# Patient Record
Sex: Male | Born: 1950 | Race: White | Hispanic: No | Marital: Married | Smoking: Former smoker
Health system: Southern US, Community
[De-identification: ages and names within clinical notes are randomized; demographics above are authoritative.]

## PROBLEM LIST (undated history)

## (undated) DIAGNOSIS — F1090 Alcohol use, unspecified, uncomplicated: Secondary | ICD-10-CM

## (undated) DIAGNOSIS — Z789 Other specified health status: Secondary | ICD-10-CM

## (undated) DIAGNOSIS — Z8619 Personal history of other infectious and parasitic diseases: Secondary | ICD-10-CM

## (undated) DIAGNOSIS — J449 Chronic obstructive pulmonary disease, unspecified: Secondary | ICD-10-CM

## (undated) DIAGNOSIS — F17200 Nicotine dependence, unspecified, uncomplicated: Secondary | ICD-10-CM

## (undated) DIAGNOSIS — R0600 Dyspnea, unspecified: Secondary | ICD-10-CM

## (undated) DIAGNOSIS — Z8709 Personal history of other diseases of the respiratory system: Secondary | ICD-10-CM

## (undated) HISTORY — PX: VARICOSE VEIN SURGERY: SHX832

## (undated) HISTORY — DX: Personal history of other infectious and parasitic diseases: Z86.19

## (undated) HISTORY — DX: Personal history of other diseases of the respiratory system: Z87.09

## (undated) HISTORY — DX: Alcohol use, unspecified, uncomplicated: F10.90

## (undated) HISTORY — DX: Nicotine dependence, unspecified, uncomplicated: F17.200

## (undated) HISTORY — DX: Other specified health status: Z78.9

---

## 2008-09-19 ENCOUNTER — Ambulatory Visit: Payer: Self-pay | Admitting: Internal Medicine

## 2008-09-19 DIAGNOSIS — I8390 Asymptomatic varicose veins of unspecified lower extremity: Secondary | ICD-10-CM | POA: Insufficient documentation

## 2008-09-19 DIAGNOSIS — F17200 Nicotine dependence, unspecified, uncomplicated: Secondary | ICD-10-CM | POA: Insufficient documentation

## 2008-10-08 ENCOUNTER — Encounter: Payer: Self-pay | Admitting: Internal Medicine

## 2008-11-30 HISTORY — PX: INGUINAL HERNIA REPAIR: SUR1180

## 2009-01-18 ENCOUNTER — Encounter: Payer: Self-pay | Admitting: Internal Medicine

## 2009-04-23 ENCOUNTER — Encounter: Payer: Self-pay | Admitting: Internal Medicine

## 2009-05-08 ENCOUNTER — Emergency Department: Payer: Self-pay | Admitting: Emergency Medicine

## 2010-08-28 ENCOUNTER — Emergency Department: Payer: Self-pay | Admitting: Emergency Medicine

## 2014-01-17 ENCOUNTER — Ambulatory Visit (INDEPENDENT_AMBULATORY_CARE_PROVIDER_SITE_OTHER): Payer: Commercial Indemnity | Admitting: Family Medicine

## 2014-01-17 ENCOUNTER — Encounter: Payer: Self-pay | Admitting: Family Medicine

## 2014-01-17 VITALS — BP 140/76 | HR 68 | Temp 98.3°F | Ht 66.75 in | Wt 184.0 lb

## 2014-01-17 DIAGNOSIS — Z23 Encounter for immunization: Secondary | ICD-10-CM

## 2014-01-17 DIAGNOSIS — R21 Rash and other nonspecific skin eruption: Secondary | ICD-10-CM | POA: Insufficient documentation

## 2014-01-17 DIAGNOSIS — Z1211 Encounter for screening for malignant neoplasm of colon: Secondary | ICD-10-CM

## 2014-01-17 DIAGNOSIS — Z789 Other specified health status: Secondary | ICD-10-CM

## 2014-01-17 DIAGNOSIS — F17200 Nicotine dependence, unspecified, uncomplicated: Secondary | ICD-10-CM

## 2014-01-17 DIAGNOSIS — I8391 Asymptomatic varicose veins of right lower extremity: Secondary | ICD-10-CM | POA: Insufficient documentation

## 2014-01-17 DIAGNOSIS — F1090 Alcohol use, unspecified, uncomplicated: Secondary | ICD-10-CM

## 2014-01-17 DIAGNOSIS — Z0000 Encounter for general adult medical examination without abnormal findings: Secondary | ICD-10-CM | POA: Insufficient documentation

## 2014-01-17 DIAGNOSIS — I8390 Asymptomatic varicose veins of unspecified lower extremity: Secondary | ICD-10-CM

## 2014-01-17 MED ORDER — TRIAMCINOLONE ACETONIDE 0.1 % EX CREA: 1.0000 "application " | TOPICAL_CREAM | Freq: Two times a day (BID) | CUTANEOUS | Status: DC

## 2014-01-17 NOTE — Patient Instructions (Signed)
Flu shot today. Good to meet you today, call us with questions. Think about sandwich bag method to help you quit smoking. For rash - may try triamcinolone cream and daily moisturizing.  If worsening, let me know. Return fasting for blood work

## 2014-01-17 NOTE — Addendum Note (Signed)
Addended by: Royann Shivers A on: 01/17/2014 11:45 AM   Modules accepted: Orders

## 2014-01-17 NOTE — Assessment & Plan Note (Signed)
With some stigmata of liver disease on exam - will check CMP and CBC at next visit. Discussed reasons to quit heavy alcohol use.

## 2014-01-17 NOTE — Assessment & Plan Note (Signed)
?  related to varicose veins.  Dry patches - treat with moisturizer and TCI cream.  Not consistent with tinea infection today.

## 2014-01-17 NOTE — Progress Notes (Signed)
BP 140/76  Pulse 68  Temp(Src) 98.3 F (36.8 C) (Oral)  Ht 5' 6.75" (1.695 m)  Wt 184 lb (83.462 kg)  BMI 29.05 kg/m2   CC: new pt to establish  Subjective:    Patient ID: Guy Hester, male    DOB: 01-20-1951, 63 y.o.   MRN: [id number]  HPI: [name] is a 63 y.o. male presenting on 01/17/2014 with Establish Care  Would like physical today.  Would like rash on leg evaluated.  2 mo h/o R leg rash.  Not spreading, some itchy.  Treating with calomine which helps.  R leg vein issues - wears compression stockings Smoker - 1 ppd.  Started smoking 1965.  50+ PY hx.  Has tried e cig and vapes, didn't do well with either.  Has tried patches which caused nightmares.  Discussed sandwich bag method. Alcohol use - up to 1.5 liter wine or 12 pack beer on weekends.  Started drinking 1965, heavy later.  Has cut back from hard liquor.  Lives with wife, daughter and granddaughter Occupation: Associate Professor Edu: HS Activity: fishes Diet: poor water, poor fruits/vegetables  Seat belt use discussed. Sunscreen use discussed  Preventative: No recent CPE Colon cancer screening - discussed options and pt would like colonoscopy. Prostate cancer screening - discussed, would like to defer screening for now. Flu shot - today Tetanus - has gotten within last 5 yrs  Relevant past medical, surgical, family and social history reviewed and updated. Allergies and medications reviewed and updated. No current outpatient prescriptions on file prior to visit.   No current facility-administered medications on file prior to visit.    Review of Systems  Constitutional: Negative for fever, chills, activity change, appetite change, fatigue and unexpected weight change.  HENT: Negative for hearing loss.   Eyes: Negative for visual disturbance.  Respiratory: Positive for cough (smoker's). Negative for chest tightness, shortness of breath and wheezing.   Cardiovascular: Negative for chest pain,  palpitations and leg swelling.  Gastrointestinal: Negative for nausea, vomiting, abdominal pain, diarrhea, constipation, blood in stool and abdominal distention.  Genitourinary: Negative for hematuria and difficulty urinating.  Musculoskeletal: Negative for arthralgias, myalgias and neck pain.  Skin: Negative for rash.  Neurological: Negative for dizziness, seizures, syncope and headaches.  Hematological: Negative for adenopathy. Does not bruise/bleed easily.  Psychiatric/Behavioral: Negative for dysphoric mood. The patient is not nervous/anxious.    Per HPI unless specifically indicated above    Objective:    BP 140/76  Pulse 68  Temp(Src) 98.3 F (36.8 C) (Oral)  Ht 5' 6.75" (1.695 m)  Wt 184 lb (83.462 kg)  BMI 29.05 kg/m2  Physical Exam  Nursing note and vitals reviewed. Constitutional: He is oriented to person, place, and time. He appears well-developed and well-nourished. No distress.  HENT:  Head: Normocephalic and atraumatic.  Right Ear: Hearing, tympanic membrane, external ear and ear canal normal.  Left Ear: Hearing, tympanic membrane, external ear and ear canal normal.  Nose: Nose normal.  Mouth/Throat: Uvula is midline, oropharynx is clear and moist and mucous membranes are normal. No oropharyngeal exudate, posterior oropharyngeal edema or posterior oropharyngeal erythema.  Eyes: Conjunctivae and EOM are normal. Pupils are equal, round, and reactive to light. No scleral icterus.  Neck: Normal range of motion. Neck supple.  Cardiovascular: Normal rate, regular rhythm, normal heart sounds and intact distal pulses.   No murmur heard. Pulses:      Radial pulses are 2+ on the right side, and 2+ on the left side.  Pulmonary/Chest: Effort normal and breath sounds normal. No respiratory distress. He has no wheezes. He has no rales.  Abdominal: Soft. Bowel sounds are normal. He exhibits no distension and no mass. There is no tenderness. There is no rebound and no guarding.    Musculoskeletal: Normal range of motion. He exhibits no edema.  Large varicose veins along RLE  Lymphadenopathy:    He has no cervical adenopathy.  Neurological: He is alert and oriented to person, place, and time.  CN grossly intact, station and gait intact  Skin: Skin is warm and dry. Rash noted.  Dry hyperpigmented patches along several of his varicose veins of RLE Spider angiomas present on lower extremities and abdomen  Psychiatric: He has a normal mood and affect. His behavior is normal. Judgment and thought content normal.   No results found for this or any previous visit.    Assessment & Plan:   Problem List Items Addressed This Visit   Asymptomatic varicose veins of right lower extremity   Health care maintenance - Primary     Preventative protocols reviewed and updated unless pt declined. Discussed healthy diet and lifestyle.  Flu shot today Pt declines prostate screening. Will refer for colonoscopy    Relevant Orders      Lipid panel      Comprehensive metabolic panel   Heavy alcohol use     With some stigmata of liver disease on exam - will check CMP and CBC at next visit. Discussed reasons to quit heavy alcohol use.    Relevant Orders      Comprehensive metabolic panel      CBC with Differential      Protime-INR   Skin rash     ?related to varicose veins.  Dry patches - treat with moisturizer and TCI cream.  Not consistent with tinea infection today.    Smoker     Discussed reasons to cut back and eventually quit smoking. Continue to encourage cessation.     Other Visit Diagnoses   Special screening for malignant neoplasms, colon        Relevant Orders       Ambulatory referral to Gastroenterology        Follow up plan: Return in about 1 year (around 01/17/2015), or as needed, for annual exam, prior fasting for blood work.

## 2014-01-17 NOTE — Progress Notes (Signed)
Pre-visit discussion using our clinic review tool. No additional management support is needed unless otherwise documented below in the visit note.  

## 2014-01-17 NOTE — Assessment & Plan Note (Signed)
Preventative protocols reviewed and updated unless pt declined. Discussed healthy diet and lifestyle.  Flu shot today Pt declines prostate screening. Will refer for colonoscopy

## 2014-01-17 NOTE — Assessment & Plan Note (Signed)
Discussed reasons to cut back and eventually quit smoking. Continue to encourage cessation.

## 2014-01-18 ENCOUNTER — Telehealth: Payer: Self-pay | Admitting: Family Medicine

## 2014-01-18 NOTE — Telephone Encounter (Signed)
Relevant patient education assigned to patient using Emmi. ° °

## 2014-01-19 ENCOUNTER — Encounter: Payer: Self-pay | Admitting: *Deleted

## 2014-01-19 ENCOUNTER — Other Ambulatory Visit (INDEPENDENT_AMBULATORY_CARE_PROVIDER_SITE_OTHER): Payer: Commercial Indemnity

## 2014-01-19 DIAGNOSIS — F1090 Alcohol use, unspecified, uncomplicated: Secondary | ICD-10-CM

## 2014-01-19 DIAGNOSIS — Z0000 Encounter for general adult medical examination without abnormal findings: Secondary | ICD-10-CM

## 2014-01-19 DIAGNOSIS — Z789 Other specified health status: Secondary | ICD-10-CM

## 2014-01-19 LAB — CBC WITH DIFFERENTIAL/PLATELET
BASOS PCT: 0.4 % (ref 0.0–3.0)
Basophils Absolute: 0 10*3/uL (ref 0.0–0.1)
Eosinophils Absolute: 0.1 10*3/uL (ref 0.0–0.7)
Eosinophils Relative: 1.3 % (ref 0.0–5.0)
HCT: 45 % (ref 39.0–52.0)
Hemoglobin: 14.9 g/dL (ref 13.0–17.0)
Lymphocytes Relative: 36.7 % (ref 12.0–46.0)
Lymphs Abs: 2.6 10*3/uL (ref 0.7–4.0)
MCHC: 33 g/dL (ref 30.0–36.0)
MCV: 95.2 fl (ref 78.0–100.0)
MONO ABS: 0.8 10*3/uL (ref 0.1–1.0)
Monocytes Relative: 12.2 % — ABNORMAL HIGH (ref 3.0–12.0)
NEUTROS PCT: 49.4 % (ref 43.0–77.0)
Neutro Abs: 3.4 10*3/uL (ref 1.4–7.7)
PLATELETS: 282 10*3/uL (ref 150.0–400.0)
RBC: 4.72 Mil/uL (ref 4.22–5.81)
RDW: 13.3 % (ref 11.5–14.6)
WBC: 7 10*3/uL (ref 4.5–10.5)

## 2014-01-19 LAB — COMPREHENSIVE METABOLIC PANEL
ALBUMIN: 4 g/dL (ref 3.5–5.2)
ALT: 11 U/L (ref 0–53)
AST: 19 U/L (ref 0–37)
Alkaline Phosphatase: 62 U/L (ref 39–117)
BUN: 12 mg/dL (ref 6–23)
CO2: 26 mEq/L (ref 19–32)
Calcium: 8.6 mg/dL (ref 8.4–10.5)
Chloride: 103 mEq/L (ref 96–112)
Creatinine, Ser: 0.9 mg/dL (ref 0.4–1.5)
GFR: 95.66 mL/min (ref >60.00)
Glucose, Bld: 88 mg/dL (ref 70–99)
POTASSIUM: 4.1 meq/L (ref 3.5–5.1)
SODIUM: 137 meq/L (ref 135–145)
TOTAL PROTEIN: 6.7 g/dL (ref 6.0–8.3)
Total Bilirubin: 0.5 mg/dL (ref 0.3–1.2)

## 2014-01-19 LAB — PROTIME-INR
INR: 1.1 ratio — AB (ref 0.8–1.0)
Prothrombin Time: 11.4 s (ref 10.2–12.4)

## 2014-01-19 LAB — LIPID PANEL
Cholesterol: 167 mg/dL (ref 0–200)
HDL: 87.2 mg/dL (ref >39.00)
LDL CALC: 69 mg/dL (ref 0–99)
Total CHOL/HDL Ratio: 2
Triglycerides: 53 mg/dL (ref 0.0–149.0)
VLDL: 10.6 mg/dL (ref 0.0–40.0)

## 2014-06-26 ENCOUNTER — Telehealth: Payer: Self-pay | Admitting: Family Medicine

## 2014-06-26 NOTE — Telephone Encounter (Signed)
Have called the patient multiple times to schedule Colonoscopy. Never returned our calls. Would like to cancel this referral. Please advise

## 2014-06-26 NOTE — Telephone Encounter (Signed)
Ok to cancel referral. thanks.

## 2015-01-08 ENCOUNTER — Emergency Department: Payer: Self-pay | Admitting: Emergency Medicine

## 2015-01-16 ENCOUNTER — Emergency Department: Payer: Self-pay | Admitting: Emergency Medicine

## 2019-05-18 ENCOUNTER — Telehealth: Payer: Self-pay | Admitting: Family Medicine

## 2019-05-18 NOTE — Telephone Encounter (Signed)
Any 30 min slot next week is fine.thanks.

## 2019-05-18 NOTE — Telephone Encounter (Signed)
Patient's wife called today to schedule appt for patient to have place on his back looked at. She stated it looks like a mole but they are not sure.   Patient has not been seen since 2015 and would need to be re est. When would be a good time to schedule him for a 30 min appt since I know many of your 30 min slots are gone  Thanks!

## 2019-05-25 ENCOUNTER — Other Ambulatory Visit: Payer: Self-pay

## 2019-05-25 ENCOUNTER — Encounter: Payer: Self-pay | Admitting: Family Medicine

## 2019-05-25 ENCOUNTER — Ambulatory Visit (INDEPENDENT_AMBULATORY_CARE_PROVIDER_SITE_OTHER): Payer: Medicare Other | Admitting: Family Medicine

## 2019-05-25 VITALS — BP 134/70 | HR 68 | Temp 97.6°F | Ht 66.75 in | Wt 211.6 lb

## 2019-05-25 DIAGNOSIS — F17200 Nicotine dependence, unspecified, uncomplicated: Secondary | ICD-10-CM | POA: Diagnosis not present

## 2019-05-25 DIAGNOSIS — W57XXXA Bitten or stung by nonvenomous insect and other nonvenomous arthropods, initial encounter: Secondary | ICD-10-CM | POA: Diagnosis not present

## 2019-05-25 DIAGNOSIS — S20362A Insect bite (nonvenomous) of left front wall of thorax, initial encounter: Secondary | ICD-10-CM | POA: Diagnosis not present

## 2019-05-25 NOTE — Assessment & Plan Note (Signed)
Tick present for the past ~2 wks. Tick fully removed. Symptoms of tick born illness reviewed. Update if these symptoms develop for abx course. Pt agrees with plan.

## 2019-05-25 NOTE — Patient Instructions (Addendum)
Tick removed. Keep area clean and dress with antibiotic ointment daily.  Watch for fever, chill, headache, new joint pains, new rash, abd pain, nausea, fatigue over the next week. Let us know if any of these symptoms develop for antibiotic.  Return for medicare wellness visit at your convenience.  Tick Bite Information, Adult  Ticks are insects that draw blood for food. Most ticks live in shrubs and grassy areas. They climb onto people and animals that brush against the leaves and grasses that they rest on. Then they bite, attaching themselves to the skin. Most ticks are harmless, but some ticks carry germs that can spread to a person through a bite and cause a disease. To reduce your risk of getting a disease from a tick bite, it is important to take steps to prevent tick bites. It is also important to check for ticks after being outdoors. If you find that a tick has attached to you, watch for symptoms of disease. How can I prevent tick bites? Take these steps to help prevent tick bites when you are outdoors in an area where ticks are found:  Use insect repellent that has DEET (20% or higher), picaridin, or IR3535 in it. Use it on: ? Skin that is showing. ? The top of your boots. ? Your pant legs. ? Your sleeve cuffs.  For repellent products that contain permethrin, follow product instructions. Use these products on: ? Clothing. ? Gear. ? Boots. ? Tents.  Wear protective clothing. Long sleeves and long pants offer the best protection from ticks.  Wear light-colored clothing so you can see ticks more easily.  Tuck your pant legs into your socks.  If you go walking on a trail, stay in the middle of the trail so your skin, hair, and clothing do not touch the bushes.  Avoid walking through areas with long grass.  Check for ticks on your clothing, hair, and skin often while you are outside, and check again before you go inside. Make sure to check the places that ticks attach themselves  most often. These places include the scalp, neck, armpits, waist, groin, and joint areas. Ticks that carry a disease called Lyme disease have to be attached to the skin for 24-48 hours. Checking for ticks every day will lessen your risk of this and other diseases.  When you come indoors, wash your clothes and take a shower or a bath right away. Dry your clothes in a dryer on high heat for at least 60 minutes. This will kill any ticks in your clothes. What is the proper way to remove a tick? If you find a tick on your body, remove it as soon as possible. Removing a tick sooner rather than later can prevent germs from passing from the tick to your body. To remove a tick that is crawling on your skin but has not bitten:  Go outdoors and brush the tick off.  Remove the tick with tape or a lint roller. To remove a tick that is attached to your skin:  Wash your hands.  If you have latex gloves, put them on.  Use tweezers, curved forceps, or a tick-removal tool to gently grasp the tick as close to your skin and the tick's head as possible.  Gently pull with steady, upward pressure until the tick lets go. When removing the tick: ? Take care to keep the tick's head attached to its body. ? Do not twist or jerk the tick. This can make the tick's head  or mouth break off. ? Do not squeeze or crush the tick's body. This could force disease-carrying fluids from the tick into your body. Do not try to remove a tick with heat, alcohol, petroleum jelly, or fingernail polish. Using these methods can cause the tick to salivate and regurgitate into your bloodstream, increasing your risk of getting a disease. What should I do after removing a tick?  Clean the bite area with soap and water, rubbing alcohol, or an iodine scrub.  If an antiseptic cream or ointment is available, apply a small amount to the bite site.  Wash and disinfect any instruments that you used to remove the tick. How should I dispose of a  tick? To dispose of a live tick, use one of these methods:  Place it in rubbing alcohol.  Place it in a sealed bag or container.  Wrap it tightly in tape.  Flush it down the toilet. Contact a health care provider if:  You have symptoms of a disease after a tick bite. Symptoms of a tick-borne disease can occur from moments after the tick bites to up to 30 days after a tick is removed. Symptoms include: ? Muscle, joint, or bone pain. ? Difficulty walking or moving your legs. ? Numbness in the legs. ? Paralysis. ? Red rash around the tick bite area that is shaped like a target or a "bull's-eye." ? Redness and swelling in the area of the tick bite. ? Fever. ? Repeated vomiting. ? Diarrhea. ? Weight loss. ? Tender, swollen lymph glands. ? Shortness of breath. ? Cough. ? Pain in the abdomen. ? Headache. ? Abnormal tiredness. ? A change in your level of consciousness. ? Confusion. Get help right away if:  You are not able to remove a tick.  A part of a tick breaks off and gets stuck in your skin.  Your symptoms get worse. Summary  Ticks may carry germs that can spread to a person through a bite and cause disease.  Wear protective clothing and use insect repellent to prevent tick bites. Follow product instructions.  If you find a tick on your body, remove it as soon as possible. If the tick is attached, do not try to remove with heat, alcohol, petroleum jelly, or fingernail polish.  Remove the attached tick using tweezers, curved forceps, or a tick-removal tool. Gently pull with steady, upward pressure until the tick lets go. Do not twist or jerk the tick. Do not squeeze or crush the tick's body.  If you have symptoms after being bitten by a tick, contact a health care provider. This information is not intended to replace advice given to you by your health care provider. Make sure you discuss any questions you have with your health care provider. Document Released:  11/13/2000 Document Revised: 08/28/2016 Document Reviewed: 08/28/2016 Elsevier Interactive Patient Education  2019 [employer].

## 2019-05-25 NOTE — Assessment & Plan Note (Signed)
Continue to encourage smoking cessation.  Discussed lung cancer screening CT program - he declines at this time.

## 2019-05-25 NOTE — Progress Notes (Signed)
This visit was conducted in person.  BP 134/70 (BP Location: Left Arm, Patient Position: Sitting, Cuff Size: Large)   Pulse 68   Temp 97.6 F (36.4 C) (Temporal)   Ht 5' 6.75" (1.695 m)   Wt 211 lb 9 oz (96 kg)   SpO2 98%   BMI 33.38 kg/m    CC: re establish Subjective:    Patient ID: Guy Hester, male    DOB: 01/08/1951, 68 y.o.   MRN: [id number]  HPI: Guy Hester is a 68 y.o. male presenting on 05/25/2019 for New Patient (Here to re-establish care. )   Last seen 2015.  Retired 09/2018! Smoking - 1 ppd for 50+ yrs. Contemplative. Has never tried anything to help him quit. Declines lung cancer screening program.   Spot on left side - present for 2 wks. Tender and itchy. Initially thought it was a tick, unsure now. Red around edges. No drainage. No fevers.   No fevers/chills, headache, new joint pains, new rash, abd pain, nausea, fatigue.      Relevant past medical, surgical, family and social history reviewed and updated as indicated. Interim medical history since our last visit reviewed. Allergies and medications reviewed and updated. Outpatient Medications Prior to Visit  Medication Sig Dispense Refill  . triamcinolone cream (KENALOG) 0.1 % Apply 1 application topically 2 (two) times daily. Apply to AA. 30 g 0   No facility-administered medications prior to visit.      Per HPI unless specifically indicated in ROS section below Review of Systems Objective:    BP 134/70 (BP Location: Left Arm, Patient Position: Sitting, Cuff Size: Large)   Pulse 68   Temp 97.6 F (36.4 C) (Temporal)   Ht 5' 6.75" (1.695 m)   Wt 211 lb 9 oz (96 kg)   SpO2 98%   BMI 33.38 kg/m   Wt Readings from Last 3 Encounters:  05/25/19 211 lb 9 oz (96 kg)  01/17/14 184 lb (83.5 kg)    Physical Exam Vitals signs and nursing note reviewed.  Constitutional:      Appearance: Normal appearance. He is not ill-appearing.  HENT:     Mouth/Throat:     Mouth: Mucous membranes are moist.     Pharynx: No posterior oropharyngeal erythema.  Eyes:     Extraocular Movements: Extraocular movements intact.     Pupils: Pupils are equal, round, and reactive to light.  Cardiovascular:     Rate and Rhythm: Normal rate and regular rhythm.     Pulses: Normal pulses.     Heart sounds: Normal heart sounds. No murmur.  Pulmonary:     Effort: Pulmonary effort is normal. No respiratory distress.     Breath sounds: Normal breath sounds. No wheezing, rhonchi or rales.  Skin:    General: Skin is warm and dry.     Findings: Erythema present.     Comments: Engorged tick on left lateral side fully removed with forceps, area cleaned with alcohol and dressed with abx ointment and bandaid.   Neurological:     Mental Status: He is alert.  Psychiatric:        Mood and Affect: Mood normal.        Behavior: Behavior normal.       Assessment & Plan:   Problem List Items Addressed This Visit    Tick bite of left side of chest wall - Primary    Tick present for the past ~2 wks. Tick fully removed. Symptoms of tick born illness  reviewed. Update if these symptoms develop for abx course. Pt agrees with plan.       Smoker    Continue to encourage smoking cessation.  Discussed lung cancer screening CT program - he declines at this time.           No orders of the defined types were placed in this encounter.  No orders of the defined types were placed in this encounter.   Patient instructions: Tick removed. Keep area clean and dress with antibiotic ointment daily.  Watch for fever, chill, headache, new joint pains, new rash, abd pain, nausea, fatigue over the next week. Let us know if any of these symptoms develop for antibiotic.  Return for medicare wellness visit at your convenience.   Follow up plan: Return if symptoms worsen or fail to improve, for medicare wellness visit.  Ria Bush, MD

## 2020-06-16 ENCOUNTER — Other Ambulatory Visit: Payer: Self-pay | Admitting: Family Medicine

## 2020-06-16 DIAGNOSIS — Z136 Encounter for screening for cardiovascular disorders: Secondary | ICD-10-CM

## 2020-06-16 DIAGNOSIS — Z125 Encounter for screening for malignant neoplasm of prostate: Secondary | ICD-10-CM

## 2020-06-16 DIAGNOSIS — Z131 Encounter for screening for diabetes mellitus: Secondary | ICD-10-CM

## 2020-06-16 DIAGNOSIS — Z1159 Encounter for screening for other viral diseases: Secondary | ICD-10-CM

## 2020-06-16 DIAGNOSIS — Z13220 Encounter for screening for lipoid disorders: Secondary | ICD-10-CM

## 2020-06-18 ENCOUNTER — Other Ambulatory Visit: Payer: Self-pay

## 2020-06-18 ENCOUNTER — Other Ambulatory Visit (INDEPENDENT_AMBULATORY_CARE_PROVIDER_SITE_OTHER): Payer: Medicare Other

## 2020-06-18 DIAGNOSIS — Z125 Encounter for screening for malignant neoplasm of prostate: Secondary | ICD-10-CM | POA: Diagnosis not present

## 2020-06-18 DIAGNOSIS — Z13220 Encounter for screening for lipoid disorders: Secondary | ICD-10-CM

## 2020-06-18 DIAGNOSIS — Z131 Encounter for screening for diabetes mellitus: Secondary | ICD-10-CM

## 2020-06-18 DIAGNOSIS — Z1159 Encounter for screening for other viral diseases: Secondary | ICD-10-CM | POA: Diagnosis not present

## 2020-06-18 DIAGNOSIS — Z136 Encounter for screening for cardiovascular disorders: Secondary | ICD-10-CM | POA: Diagnosis not present

## 2020-06-18 LAB — LIPID PANEL
Cholesterol: 156 mg/dL (ref 0–200)
HDL: 51.4 mg/dL
LDL Cholesterol: 85 mg/dL (ref 0–99)
NonHDL: 104.95
Total CHOL/HDL Ratio: 3
Triglycerides: 100 mg/dL (ref 0.0–149.0)
VLDL: 20 mg/dL (ref 0.0–40.0)

## 2020-06-18 LAB — COMPREHENSIVE METABOLIC PANEL
ALT: 23 U/L (ref 0–53)
AST: 32 U/L (ref 0–37)
Albumin: 3.6 g/dL (ref 3.5–5.2)
Alkaline Phosphatase: 99 U/L (ref 39–117)
BUN: 10 mg/dL (ref 6–23)
CO2: 29 mEq/L (ref 19–32)
Calcium: 9.2 mg/dL (ref 8.4–10.5)
Chloride: 101 mEq/L (ref 96–112)
Creatinine, Ser: 1.08 mg/dL (ref 0.40–1.50)
GFR: 67.84 mL/min (ref >60.00)
Glucose, Bld: 161 mg/dL — ABNORMAL HIGH (ref 70–99)
Potassium: 4.7 mEq/L (ref 3.5–5.1)
Sodium: 138 mEq/L (ref 135–145)
Total Bilirubin: 0.6 mg/dL (ref 0.2–1.2)
Total Protein: 6.1 g/dL (ref 6.0–8.3)

## 2020-06-18 LAB — PSA, MEDICARE: PSA: 1.87 ng/mL (ref 0.10–4.00)

## 2020-06-19 LAB — HEPATITIS C ANTIBODY
Hepatitis C Ab: NONREACTIVE
SIGNAL TO CUT-OFF: 0.01 (ref <1.00)

## 2020-06-25 ENCOUNTER — Ambulatory Visit (INDEPENDENT_AMBULATORY_CARE_PROVIDER_SITE_OTHER)
Admission: RE | Admit: 2020-06-25 | Discharge: 2020-06-25 | Disposition: A | Discharge status: Home / Self Care | Payer: Medicare Other | Source: Ambulatory Visit | Attending: Family Medicine | Admitting: Family Medicine

## 2020-06-25 ENCOUNTER — Ambulatory Visit (INDEPENDENT_AMBULATORY_CARE_PROVIDER_SITE_OTHER): Payer: Medicare Other | Admitting: Family Medicine

## 2020-06-25 ENCOUNTER — Encounter: Payer: Self-pay | Admitting: Family Medicine

## 2020-06-25 ENCOUNTER — Other Ambulatory Visit: Payer: Self-pay

## 2020-06-25 VITALS — BP 124/80 | HR 101 | Temp 97.4°F | Ht 66.75 in | Wt 203.5 lb

## 2020-06-25 DIAGNOSIS — Z789 Other specified health status: Secondary | ICD-10-CM

## 2020-06-25 DIAGNOSIS — Z23 Encounter for immunization: Secondary | ICD-10-CM | POA: Diagnosis not present

## 2020-06-25 DIAGNOSIS — F17200 Nicotine dependence, unspecified, uncomplicated: Secondary | ICD-10-CM

## 2020-06-25 DIAGNOSIS — E119 Type 2 diabetes mellitus without complications: Secondary | ICD-10-CM | POA: Diagnosis not present

## 2020-06-25 DIAGNOSIS — R739 Hyperglycemia, unspecified: Secondary | ICD-10-CM

## 2020-06-25 DIAGNOSIS — R918 Other nonspecific abnormal finding of lung field: Secondary | ICD-10-CM | POA: Diagnosis not present

## 2020-06-25 DIAGNOSIS — R0602 Shortness of breath: Secondary | ICD-10-CM

## 2020-06-25 DIAGNOSIS — J449 Chronic obstructive pulmonary disease, unspecified: Secondary | ICD-10-CM | POA: Diagnosis not present

## 2020-06-25 DIAGNOSIS — J90 Pleural effusion, not elsewhere classified: Secondary | ICD-10-CM | POA: Diagnosis not present

## 2020-06-25 DIAGNOSIS — Z0000 Encounter for general adult medical examination without abnormal findings: Secondary | ICD-10-CM | POA: Diagnosis not present

## 2020-06-25 DIAGNOSIS — F1090 Alcohol use, unspecified, uncomplicated: Secondary | ICD-10-CM

## 2020-06-25 DIAGNOSIS — Z1211 Encounter for screening for malignant neoplasm of colon: Secondary | ICD-10-CM | POA: Diagnosis not present

## 2020-06-25 DIAGNOSIS — Z7189 Other specified counseling: Secondary | ICD-10-CM | POA: Diagnosis not present

## 2020-06-25 LAB — POCT GLYCOSYLATED HEMOGLOBIN (HGB A1C): Hemoglobin A1C: 6.9 % — AB (ref 4.0–5.6)

## 2020-06-25 MED ORDER — SPIRIVA HANDIHALER 18 MCG IN CAPS
18.0000 ug | ORAL_CAPSULE | Freq: Every day | RESPIRATORY_TRACT | 12 refills | Status: AC
Start: 2020-06-25 — End: ?

## 2020-06-25 MED ORDER — ALBUTEROL SULFATE HFA 108 (90 BASE) MCG/ACT IN AERS
2.0000 | INHALATION_SPRAY | Freq: Four times a day (QID) | RESPIRATORY_TRACT | 3 refills | Status: AC | PRN
Start: 2020-06-25 — End: ?

## 2020-06-25 MED ORDER — NICOTINE 14 MG/24HR TD PT24
14.0000 mg | MEDICATED_PATCH | Freq: Every day | TRANSDERMAL | 1 refills | Status: AC
Start: 2020-06-25 — End: ?

## 2020-06-25 NOTE — Assessment & Plan Note (Signed)
Has cut down to 2 glasses of wine/day - significant decrease from 3 L/day. Encouraged ongoing cessation.

## 2020-06-25 NOTE — Assessment & Plan Note (Signed)
Long-term 1 ppd smoker.  Currently in action phase.  Discussed NRT - sent in nicoderm cq patches 14mg /day. Discussed slow taper over weeks. rec minimizing smoking while using patch.

## 2020-06-25 NOTE — Assessment & Plan Note (Signed)
New diagnosis based on A1c 6.9%.  Reviewed low sugar low carb diet.  rec RTC 3 mo DM f/u visit.

## 2020-06-25 NOTE — Progress Notes (Signed)
This visit was conducted in person.  BP 124/80 (BP Location: Left Arm, Patient Position: Sitting, Cuff Size: Normal)   Pulse 101   Temp (!) 97.4 F (36.3 C) (Temporal)   Ht 5' 6.75" (1.695 m)   Wt (!) 203 lb 8 oz (92.3 kg)   SpO2 96%   BMI 32.11 kg/m    CC: CPE Subjective:    Patient ID: Guy Hester, male    DOB: 26-Apr-1951, 69 y.o.   MRN: [id number]  HPI: Guy Hester is a 69 y.o. male presenting on 06/25/2020 for Annual Exam   Received medicare 2 yrs ago.  Did not see health advisor this year.   No exam data present    Office Visit from 06/25/2020 in [place] at [facility] Total Score 1      No flowsheet data found.    Progressive dyspnea worse in humidity over several months. No fever.   Preventative: Colon cancer screening - discussed options - would like iFOB  Prostate cancer screening - discussed - check PSA  Lung cancer screening - discussed - interested.  Flu shot - tries yearly Td 10/20209 Prevnar today  COVID vaccine - considering  Shingrix - did not discuss Advanced directive discussion - deferred with abnormal CXR findings.  Seat belt use discussed Sunscreen use discussed. No changing moles on skin. Smoking - 1 ppd for 50+ yrs. Action phase - wife wants him to quit. "i'm gonna quit today" - has previously tried patches.  Alcohol  - cut down 1-2 months ago - from 3L/day down to 2 glasses of wine/day  Dentist - has dentures Eye exam - due Bowel - no constipation. Notes decreased appetite.  Bladder - no incontinence  Lives with wife, daughter and granddaughter  Occupation: Associate Professor - retired 2019 Edu: HS  Activity: fishes  Diet: poor water, poor fruits/vegetables     Relevant past medical, surgical, family and social history reviewed and updated as indicated. Interim medical history since our last visit reviewed. Allergies and medications reviewed and updated. No outpatient medications prior to visit.   No  facility-administered medications prior to visit.     Per HPI unless specifically indicated in ROS section below Review of Systems Objective:  BP 124/80 (BP Location: Left Arm, Patient Position: Sitting, Cuff Size: Normal)   Pulse 101   Temp (!) 97.4 F (36.3 C) (Temporal)   Ht 5' 6.75" (1.695 m)   Wt (!) 203 lb 8 oz (92.3 kg)   SpO2 96%   BMI 32.11 kg/m   Wt Readings from Last 3 Encounters:  06/25/20 (!) 203 lb 8 oz (92.3 kg)  05/25/19 211 lb 9 oz (96 kg)  01/17/14 184 lb (83.5 kg)      Physical Exam Vitals and nursing note reviewed.  Constitutional:      General: He is not in acute distress.    Appearance: Normal appearance. He is well-developed. He is not ill-appearing.  HENT:     Head: Normocephalic and atraumatic.     Right Ear: Hearing, tympanic membrane, ear canal and external ear normal.     Left Ear: Hearing, tympanic membrane, ear canal and external ear normal.     Mouth/Throat:     Pharynx: Uvula midline.  Eyes:     General: No scleral icterus.    Extraocular Movements: Extraocular movements intact.     Conjunctiva/sclera: Conjunctivae normal.     Pupils: Pupils are equal, round, and reactive to light.  Neck:  Thyroid: No thyroid mass or thyromegaly.     Vascular: No carotid bruit.  Cardiovascular:     Rate and Rhythm: Normal rate and regular rhythm.     Pulses: Normal pulses.          Radial pulses are 2+ on the right side and 2+ on the left side.     Heart sounds: Normal heart sounds. No murmur heard.   Pulmonary:     Effort: No respiratory distress.     Breath sounds: Rhonchi (left sided) present. No wheezing or rales.     Comments: Coarse breath sounds L>R Abdominal:     General: Abdomen is flat. Bowel sounds are normal. There is no distension.     Palpations: Abdomen is soft. There is no mass.     Tenderness: There is no abdominal tenderness. There is no guarding or rebound.     Hernia: No hernia is present.  Musculoskeletal:        General:  Normal range of motion.     Cervical back: Normal range of motion and neck supple.     Right lower leg: No edema.     Left lower leg: No edema.  Lymphadenopathy:     Cervical: No cervical adenopathy.  Skin:    General: Skin is warm and dry.     Findings: No rash.  Neurological:     General: No focal deficit present.     Mental Status: He is alert and oriented to person, place, and time.     Comments:  CN grossly intact, station and gait intact Recall 2/3, 3/3 with cue Calculation 5/5 DLROW   Psychiatric:        Mood and Affect: Mood normal.        Behavior: Behavior normal.        Thought Content: Thought content normal.        Judgment: Judgment normal.       Results for orders placed or performed in visit on 06/25/20  HgB A1c  Result Value Ref Range   Hemoglobin A1C 6.9 (A) 4.0 - 5.6 %   HbA1c POC (<> result, manual entry)     HbA1c, POC (prediabetic range)     HbA1c, POC (controlled diabetic range)    DG Chest 2 View CLINICAL DATA:  Dyspnea, smoker, COPD  EXAM: CHEST - 2 VIEW  COMPARISON:  08/29/2010  FINDINGS: The heart size and mediastinal contours are within normal limits. There is a small right pleural effusion with fluid in the minor fissure and a dense, masslike opacity of the perihilar right lung. Left lung is hyperinflated although normally aerated. The visualized skeletal structures are unremarkable.  IMPRESSION: There is a small right pleural effusion with fluid in the minor fissure and a dense, masslike opacity of the perihilar right lung, findings concerning for malignancy, although infection is a differential consideration. Recommend CT to further evaluate.  These results will be called to the ordering clinician or representative by the Radiologist Assistant, and communication documented in the PACS or [employer].  Electronically Signed   By: Eddie Candle M.D.   On: 06/25/2020 09:54   Assessment & Plan:  This visit occurred during the  SARS-CoV-2 public health emergency.  Safety protocols were in place, including screening questions prior to the visit, additional usage of staff PPE, and extensive cleaning of exam room while observing appropriate contact time as indicated for disinfecting solutions.   Problem List Items Addressed This Visit    Smoker  Long-term 1 ppd smoker.  Currently in action phase.  Discussed NRT - sent in nicoderm cq patches 14mg /day. Discussed slow taper over weeks. rec minimizing smoking while using patch.       Shortness of breath    Progressively worsening over the past year - anticipate COPD component. Start albuterol inhaler, start spiriva. Reviewed rescue vs controller medication.  Check CXR given dyspnea in smoker - concern for L hilar lung mass. Will order contrasted CT scan.       Relevant Orders   DG Chest 2 View (Completed)   CT Chest W Contrast   Medicare annual wellness visit, initial - Primary    I have personally reviewed the Medicare Annual Wellness questionnaire and have noted 1. The patient's medical and social history 2. Their use of alcohol, tobacco or illicit drugs 3. Their current medications and supplements 4. The patient's functional ability including ADL's, fall risks, home safety risks and hearing or visual impairment. Cognitive function has been assessed and addressed as indicated.  5. Diet and physical activity 6. Evidence for depression or mood disorders The patients weight, height, BMI have been recorded in the chart. I have made referrals, counseling and provided education to the patient based on review of the above and I have provided the pt with a written personalized care plan for preventive services. Provider list updated.. See scanned questionairre as needed for further documentation. Reviewed preventative protocols and updated unless pt declined.       Heavy alcohol use    Has cut down to 2 glasses of wine/day - significant decrease from 3 L/day.  Encouraged ongoing cessation.       Controlled type 2 diabetes mellitus without complication, without long-term current use of insulin ([facility])    New diagnosis based on A1c 6.9%.  Reviewed low sugar low carb diet.  rec RTC 3 mo DM f/u visit.       Advanced care planning/counseling discussion    Advanced directive discussion - deferred with abnormal CXR findings.        Other Visit Diagnoses    Need for pneumococcal vaccination       Relevant Orders   Pneumococcal conjugate vaccine 13-valent (Completed)   Special screening for malignant neoplasms, colon       Relevant Orders   Fecal occult blood, imunochemical   Hyperglycemia       Relevant Orders   HgB A1c (Completed)   Mass of left lung       Relevant Orders   CT Chest W Contrast       Meds ordered this encounter  Medications  . nicotine (NICODERM CQ) 14 mg/24hr patch    Sig: Place 1 patch (14 mg total) onto the skin daily.    Dispense:  28 patch    Refill:  1  . tiotropium (SPIRIVA HANDIHALER) 18 MCG inhalation capsule    Sig: Place 1 capsule (18 mcg total) into inhaler and inhale daily.    Dispense:  30 capsule    Refill:  12  . albuterol (VENTOLIN HFA) 108 (90 Base) MCG/ACT inhaler    Sig: Inhale 2 puffs into the lungs every 6 (six) hours as needed for wheezing or shortness of breath.    Dispense:  18 g    Refill:  3    Formulate based on affordability   Orders Placed This Encounter  Procedures  . Fecal occult blood, imunochemical    Standing Status:   Future    Standing Expiration Date:  06/25/2021  . DG Chest 2 View    Standing Status:   Future    Number of Occurrences:   1    Standing Expiration Date:   06/25/2021    Order Specific Question:   Reason for Exam (SYMPTOM  OR DIAGNOSIS REQUIRED)    Answer:   dyspnea, smoker eval COPD    Order Specific Question:   Preferred imaging location?    Answer:   [facility]    Order Specific Question:   Radiology Contrast Protocol - do NOT remove file path      Answer:   \\[email or web address]\DXFluoroContrastProtocols.pdf  . CT Chest W Contrast    Standing Status:   Future    Standing Expiration Date:   06/25/2021    Order Specific Question:   If indicated for the ordered procedure, I authorize the administration of contrast media per Radiology protocol    Answer:   Yes    Order Specific Question:   Preferred imaging location?    Answer:   [facility]    Order Specific Question:   Radiology Contrast Protocol - do NOT remove file path    Answer:   \\[email or web address]\Radiant\CTProtocols.pdf  . Pneumococcal conjugate vaccine 13-valent  . HgB A1c    Patient instructions: Prevnar13 first pneumonia shot today. I will ask lung cancer screening nurse to call you about CT scan program.   I do recommend COVID vaccine - consider getting.  Chest xray today.  Trial combivent inhaler as needed for shortness of breath, as well as daily controller medicine to help keep lungs breathing well.  A1c today given sugars were high - diet controlled diabetes range. Work on low sugar low carb diet to keep these levels under control. Return in 3 months for diabetes follow up visit.   Follow up plan: Return in about 3 months (around 09/25/2020) for follow up visit.  Ria Bush, MD

## 2020-06-25 NOTE — Patient Instructions (Addendum)
Prevnar13 first pneumonia shot today. I will ask lung cancer screening nurse to call you about CT scan program.   I do recommend COVID vaccine - consider getting.  Chest xray today.  Trial combivent inhaler as needed for shortness of breath, as well as daily controller medicine to help keep lungs breathing well.  A1c today given sugars were high - diet controlled diabetes range. Work on low sugar low carb diet to keep these levels under control. Return in 3 months for diabetes follow up visit.   Health Maintenance After Age 68 After age 50, you are at a higher risk for certain long-term diseases and infections as well as injuries from falls. Falls are a major cause of broken bones and head injuries in people who are older than age 7. Getting regular preventive care can help to keep you healthy and well. Preventive care includes getting regular testing and making lifestyle changes as recommended by your health care provider. Talk with your health care provider about:  Which screenings and tests you should have. A screening is a test that checks for a disease when you have no symptoms.  A diet and exercise plan that is right for you. What should I know about screenings and tests to prevent falls? Screening and testing are the best ways to find a health problem early. Early diagnosis and treatment give you the best chance of managing medical conditions that are common after age 36. Certain conditions and lifestyle choices may make you more likely to have a fall. Your health care provider may recommend:  Regular vision checks. Poor vision and conditions such as cataracts can make you more likely to have a fall. If you wear glasses, make sure to get your prescription updated if your vision changes.  Medicine review. Work with your health care provider to regularly review all of the medicines you are taking, including over-the-counter medicines. Ask your health care provider about any side effects that  may make you more likely to have a fall. Tell your health care provider if any medicines that you take make you feel dizzy or sleepy.  Osteoporosis screening. Osteoporosis is a condition that causes the bones to get weaker. This can make the bones weak and cause them to break more easily.  Blood pressure screening. Blood pressure changes and medicines to control blood pressure can make you feel dizzy.  Strength and balance checks. Your health care provider may recommend certain tests to check your strength and balance while standing, walking, or changing positions.  Foot health exam. Foot pain and numbness, as well as not wearing proper footwear, can make you more likely to have a fall.  Depression screening. You may be more likely to have a fall if you have a fear of falling, feel emotionally low, or feel unable to do activities that you used to do.  Alcohol use screening. Using too much alcohol can affect your balance and may make you more likely to have a fall. What actions can I take to lower my risk of falls? General instructions  Talk with your health care provider about your risks for falling. Tell your health care provider if: ? You fall. Be sure to tell your health care provider about all falls, even ones that seem minor. ? You feel dizzy, sleepy, or off-balance.  Take over-the-counter and prescription medicines only as told by your health care provider. These include any supplements.  Eat a healthy diet and maintain a healthy weight. A healthy diet  includes low-fat dairy products, low-fat (lean) meats, and fiber from whole grains, beans, and lots of fruits and vegetables. Home safety  Remove any tripping hazards, such as rugs, cords, and clutter.  Install safety equipment such as grab bars in bathrooms and safety rails on stairs.  Keep rooms and walkways well-lit. Activity   Follow a regular exercise program to stay fit. This will help you maintain your balance. Ask your  health care provider what types of exercise are appropriate for you.  If you need a cane or walker, use it as recommended by your health care provider.  Wear supportive shoes that have nonskid soles. Lifestyle  Do not drink alcohol if your health care provider tells you not to drink.  If you drink alcohol, limit how much you have: ? 0-1 drink a day for women. ? 0-2 drinks a day for men.  Be aware of how much alcohol is in your drink. In the [place] one drink equals one typical bottle of beer (12 oz), one-half glass of wine (5 oz), or one shot of hard liquor (1 oz).  Do not use any products that contain nicotine or tobacco, such as cigarettes and e-cigarettes. If you need help quitting, ask your health care provider. Summary  Having a healthy lifestyle and getting preventive care can help to protect your health and wellness after age 47.  Screening and testing are the best way to find a health problem early and help you avoid having a fall. Early diagnosis and treatment give you the best chance for managing medical conditions that are more common for people who are older than age 49.  Falls are a major cause of broken bones and head injuries in people who are older than age 18. Take precautions to prevent a fall at home.  Work with your health care provider to learn what changes you can make to improve your health and wellness and to prevent falls. This information is not intended to replace advice given to you by your health care provider. Make sure you discuss any questions you have with your health care provider. Document Revised: 03/09/2019 Document Reviewed: 09/29/2017 Elsevier Patient Education  2020 [employer].

## 2020-06-25 NOTE — Assessment & Plan Note (Signed)

## 2020-06-25 NOTE — Assessment & Plan Note (Signed)
Progressively worsening over the past year - anticipate COPD component. Start albuterol inhaler, start spiriva. Reviewed rescue vs controller medication.  Check CXR given dyspnea in smoker - concern for L hilar lung mass. Will order contrasted CT scan.

## 2020-06-25 NOTE — Assessment & Plan Note (Signed)
Advanced directive discussion - deferred with abnormal CXR findings.

## 2020-06-26 ENCOUNTER — Telehealth: Payer: Self-pay | Admitting: *Deleted

## 2020-06-26 ENCOUNTER — Other Ambulatory Visit: Payer: Self-pay

## 2020-06-26 ENCOUNTER — Telehealth: Payer: Self-pay | Admitting: Internal Medicine

## 2020-06-26 ENCOUNTER — Ambulatory Visit
Admission: RE | Admit: 2020-06-26 | Discharge: 2020-06-26 | Disposition: A | Discharge status: Home / Self Care | Payer: Medicare Other | Source: Ambulatory Visit | Attending: Family Medicine | Admitting: Family Medicine

## 2020-06-26 DIAGNOSIS — R0602 Shortness of breath: Secondary | ICD-10-CM | POA: Diagnosis not present

## 2020-06-26 DIAGNOSIS — C3490 Malignant neoplasm of unspecified part of unspecified bronchus or lung: Secondary | ICD-10-CM | POA: Diagnosis not present

## 2020-06-26 DIAGNOSIS — R918 Other nonspecific abnormal finding of lung field: Secondary | ICD-10-CM | POA: Diagnosis not present

## 2020-06-26 DIAGNOSIS — I2510 Atherosclerotic heart disease of native coronary artery without angina pectoris: Secondary | ICD-10-CM | POA: Diagnosis not present

## 2020-06-26 DIAGNOSIS — J90 Pleural effusion, not elsewhere classified: Secondary | ICD-10-CM | POA: Diagnosis not present

## 2020-06-26 DIAGNOSIS — I700 Atherosclerosis of aorta: Secondary | ICD-10-CM | POA: Diagnosis not present

## 2020-06-26 MED ORDER — IOHEXOL 300 MG/ML  SOLN
75.0000 mL | Freq: Once | INTRAMUSCULAR | Status: AC | PRN
  Administered 2020-06-26: 75 mL via INTRAVENOUS

## 2020-06-26 NOTE — Telephone Encounter (Signed)
Scheduled patient with Dr Learta Codding for this Friday 06/28/20 , patient notified. Also Dr Learta Codding has ordered an Interventional Radiology Referral for Biopsy and I told the patient to expect a call from them also.

## 2020-06-26 NOTE — Telephone Encounter (Signed)
I spoke with Guy Hester  Appt with Dr Vaughan Browner set for Friday at 3 pm  Per Mannam order IR consult for liver Biopsy- referral placed  She has left msg for pt to be aware of appt

## 2020-06-26 NOTE — Telephone Encounter (Signed)
Noted! Thank you

## 2020-06-26 NOTE — Telephone Encounter (Signed)
Spoke with patient and wife on speaker phone, reviewing recent CT. Will expedite pulm or onc evaluation.

## 2020-06-26 NOTE — Telephone Encounter (Signed)
Colletta Maryland called with stat results of a CT scan. Results are in Epic and they are not good. Phone call put on hold for Dr. Danise Mina to speak to the patient.

## 2020-06-27 ENCOUNTER — Encounter: Payer: Self-pay | Admitting: Family Medicine

## 2020-06-27 ENCOUNTER — Other Ambulatory Visit: Payer: Self-pay | Admitting: Pulmonary Disease

## 2020-06-27 DIAGNOSIS — R160 Hepatomegaly, not elsewhere classified: Secondary | ICD-10-CM

## 2020-06-28 ENCOUNTER — Encounter: Payer: Self-pay | Admitting: Pulmonary Disease

## 2020-06-28 ENCOUNTER — Other Ambulatory Visit: Payer: Self-pay

## 2020-06-28 ENCOUNTER — Encounter (HOSPITAL_COMMUNITY): Payer: Self-pay

## 2020-06-28 ENCOUNTER — Ambulatory Visit (INDEPENDENT_AMBULATORY_CARE_PROVIDER_SITE_OTHER): Payer: Medicare Other | Admitting: Pulmonary Disease

## 2020-06-28 VITALS — BP 142/84 | HR 114 | Temp 98.3°F | Ht 66.75 in | Wt 204.6 lb

## 2020-06-28 DIAGNOSIS — R918 Other nonspecific abnormal finding of lung field: Secondary | ICD-10-CM | POA: Diagnosis not present

## 2020-06-28 NOTE — Progress Notes (Signed)
Trayton Szabo    [id number]    [birth year]  Primary Care Physician:Gutierrez, Garlon Hatchet, MD  Referring Physician: Ria Bush, MD [address] [place],  [place] [address]  Chief complaint: Consult for lung mass  HPI: 69 year old smoker found to have abnormal chest x-ray on wellness visit at Dr. Danise Mina, his primary care.  Subsequent CT showed findings concerning for stage IV lung cancer with right hilar mass and has been referred to pulmonary for further evaluation.  Chief complaint is cough.  Denies any dyspnea, hemoptysis, weight loss  Pets: Dog, cat, parrot for 6 years Occupation: Retired Furniture conservator/restorer Exposures: No known exposures.  No mold, hot tub, Jacuzzi Smoking history: 50-pack-year smoker.  Quit 3 days ago Travel history: Originally from [place].  No significant recent travel Relevant family history: No significant family issue of lung disease  Outpatient Encounter Medications as of 06/28/2020  Medication Sig   albuterol (VENTOLIN HFA) 108 (90 Base) MCG/ACT inhaler Inhale 2 puffs into the lungs every 6 (six) hours as needed for wheezing or shortness of breath.   nicotine (NICODERM CQ) 14 mg/24hr patch Place 1 patch (14 mg total) onto the skin daily.   tiotropium (SPIRIVA HANDIHALER) 18 MCG inhalation capsule Place 1 capsule (18 mcg total) into inhaler and inhale daily.   No facility-administered encounter medications on file as of 06/28/2020.    Allergies as of 06/28/2020   (No Known Allergies)    Past Medical History:  Diagnosis Date   Heavy alcohol use    History of asthma    childhood   History of chicken pox    Smoker     Past Surgical History:  Procedure Laterality Date   INGUINAL HERNIA REPAIR Right 2010   VARICOSE VEIN SURGERY Right    laser    Family History  Problem Relation Age of Onset   Diabetes Mother    Stroke Mother 24       x3   Aneurysm Father        thoracic aortic aneurysm rupture   Diabetes  Brother    Diabetes Brother    Cancer Neg Hx    CAD Neg Hx     Social History   Socioeconomic History   Marital status: Married    Spouse name: Not on file   Number of children: Not on file   Years of education: Not on file   Highest education level: Not on file  Occupational History   Not on file  Tobacco Use   Smoking status: Former Smoker    Packs/day: 1.00    Years: 50.00    Pack years: 50.00    Types: Cigarettes    Start date: 12/01/1963    Quit date: 06/24/2020    Years since quitting: 0.0   Smokeless tobacco: Never Used   Tobacco comment: stopped 06/24/20  Substance and Sexual Activity   Alcohol use: Yes    Comment: Regular-daily beer and wine   Drug use: No   Sexual activity: Not on file  Other Topics Concern   Not on file  Social History Narrative   Lives with wife, daughter and granddaughter   Occupation: Associate Professor   Edu: HS   Activity: fishes   Diet: poor water, poor fruits/vegetables   Social Determinants of Health   Financial Resource Strain:    Difficulty of Paying Living Expenses:   Food Insecurity:    Worried About Estate manager/land agent of Food in the Last Year:  Ran Out of Food in the Last Year:   Transportation Needs:    Film/video editor (Medical):    Lack of Transportation (Non-Medical):   Physical Activity:    Days of Exercise per Week:    Minutes of Exercise per Session:   Stress:    Feeling of Stress :   Social Connections:    Frequency of Communication with Friends and Family:    Frequency of Social Gatherings with Friends and Family:    Attends Religious Services:    Active Member of Clubs or Organizations:    Attends Music therapist:    Marital Status:   Intimate Partner Violence:    Fear of Current or Ex-Partner:    Emotionally Abused:    Physically Abused:    Sexually Abused:     Review of systems: Review of Systems  Constitutional: Negative for fever and chills.  HENT:  Negative.   Eyes: Negative for blurred vision.  Respiratory: as per HPI  Cardiovascular: Negative for chest pain and palpitations.  Gastrointestinal: Negative for vomiting, diarrhea, blood per rectum. Genitourinary: Negative for dysuria, urgency, frequency and hematuria.  Musculoskeletal: Negative for myalgias, back pain and joint pain.  Skin: Negative for itching and rash.  Neurological: Negative for dizziness, tremors, focal weakness, seizures and loss of consciousness.  Endo/Heme/Allergies: Negative for environmental allergies.  Psychiatric/Behavioral: Negative for depression, suicidal ideas and hallucinations.  All other systems reviewed and are negative.  Physical Exam: Blood pressure (!) 142/84, pulse (!) 114, temperature 98.3 F (36.8 C), temperature source Oral, height 5' 6.75" (1.695 m), weight (!) 204 lb 9.6 oz (92.8 kg), SpO2 98 %. Gen:      No acute distress HEENT:  EOMI, sclera anicteric Neck:     No masses; no thyromegaly Lungs:    Clear to auscultation bilaterally; normal respiratory effort CV:         Regular rate and rhythm; no murmurs Abd:      + bowel sounds; soft, non-tender; no palpable masses, no distension Ext:    No edema; adequate peripheral perfusion Skin:      Warm and dry; no rash Neuro: alert and oriented x 3 Psych: normal mood and affect  Data Reviewed: Imaging: CT chest 06/26/2020-extensive disease with right upper lobe, right hilar mass, lymph nodes, effusion, liver, adrenal mets. I have reviewed the images personally  PFTs:  Labs:  Assessment:  Abnormal CT Findings are concerning for metastatic lung cancer I have discussed scan with interventional radiology at [facility].  The best approach would be lymph node in the upper chest, subclavicular area.  The pleural effusion is small and would be likely be of low yield and the liver lesions are not very well defined. He has been scheduled for CT biopsy on 8/4.  If this is nondiagnostic then we will  proceed with bronchoscopy and endobronchial ultrasound biopsy  Patient and his wife had multiple questions about prognosis, stage of cancer.  All questions answered.  Plan/Recommendations: IR CT-guided biopsy Follow-up in 1 month.  Marshell Garfinkel MD [facility] 06/28/2020, 3:05 PM  CC: Ria Bush, MD

## 2020-06-28 NOTE — Patient Instructions (Signed)
I have reviewed your CT scan with you with findings that are highly suspicious for malignancy You are scheduled for CT-guided biopsy next week.  If that is nondiagnostic then plan be would be to do a bronchoscopic biopsy I will follow with you and determine next steps as needed Follow-up in clinic in 1 to 2 months.

## 2020-06-28 NOTE — Progress Notes (Signed)
[place] Male, 69 y.o., 04-18-1951 MRN:  [id number] Phone:  [phone] Jerilynn Mages) PCP:  Ria Bush, MD Primary Cvg:  Medicare/Medicare Part A And B Next Appt With Radiology ([facility] 3) 07/03/2020 at 8:00 AM  RE: Biopsy Received: Yesterday Message Details  Corrie Mckusick, DO  [facility] [place] for image guided biopsy of right chest wall mass image 108 of series CT-2, from chest CT 7/28.   Discussed with Dr. Vaughan Browner.   Would perform in [place] with [place].   Earleen Newport   Previous Messages  ----- Message -----  From: Lenore Cordia  Sent: 06/27/2020 12:13 PM EDT  To: Ir Procedure Requests  Subject: Biopsy                      Procedure Requested: US Biopsy (liver)    Reason for Procedure: liver mass    Provider Requesting: Marshell Garfinkel  Provider Telephone: [phone]    Other Info:

## 2020-07-01 ENCOUNTER — Ambulatory Visit
Admission: RE | Admit: 2020-07-01 | Discharge: 2020-07-01 | Disposition: A | Discharge status: Home / Self Care | Payer: Medicare Other | Source: Ambulatory Visit | Attending: Pulmonary Disease | Admitting: Pulmonary Disease

## 2020-07-01 ENCOUNTER — Other Ambulatory Visit: Payer: Self-pay

## 2020-07-01 DIAGNOSIS — Z20822 Contact with and (suspected) exposure to covid-19: Secondary | ICD-10-CM | POA: Insufficient documentation

## 2020-07-01 DIAGNOSIS — Z01812 Encounter for preprocedural laboratory examination: Secondary | ICD-10-CM | POA: Diagnosis not present

## 2020-07-02 ENCOUNTER — Other Ambulatory Visit: Payer: Self-pay | Admitting: Student

## 2020-07-02 LAB — SARS CORONAVIRUS 2 (TAT 6-24 HRS): SARS Coronavirus 2: NEGATIVE

## 2020-07-02 NOTE — H&P (Signed)
Chief Complaint: Patient was seen in consultation today for an image-guided biopsy of a right chest wall mass.    Referring Physician(s): Mannam,Praveen  Supervising Physician: Corrie Mckusick  Patient Status: [facility] - Out-pt  History of Present Illness: Guy Hester is a 69 y.o. male with a medical history that includes asthma, COPD, smoking and heavy alcohol use. He presented to his PCP in July 2021 for progressively worsening shortness of breath over the past year. A mass-like opacity in the perihilar region of the right lung was seen on CXR 06/25/20. CT imaging was performed.    CT chest w/contrast 06/26/20 IMPRESSION: 1. Stage IV lung cancer as evidenced by conglomerate right paratracheal/right hilar adenopathy and masslike consolidation in the right upper lobe with pleuroparenchymal, hepatic, adrenal and abdominal nodal metastases. 2. Vague area of low attenuation in the pancreatic body. Difficult to exclude metastatic disease. 3. Cirrhosis. 4. Aortic atherosclerosis (ICD10-I70.0). Coronary artery calcification. 5. Emphysema (ICD10-J43.9).  Interventional Radiology has been asked to evaluate this patient for an image-guided biopsy of a right chest wall mass for further work up and diagnosis. This case has been reviewed and procedure approved by Dr. Earleen Newport.   Past Medical History:  Diagnosis Date   COPD (chronic obstructive pulmonary disease) (HCC)    Dyspnea    Heavy alcohol use    History of asthma    childhood   History of chicken pox    Smoker     Past Surgical History:  Procedure Laterality Date   INGUINAL HERNIA REPAIR Right 2010   VARICOSE VEIN SURGERY Right    laser    Allergies: Patient has no known allergies.  Medications: Prior to Admission medications   Medication Sig Start Date End Date Taking? Authorizing Provider  albuterol (VENTOLIN HFA) 108 (90 Base) MCG/ACT inhaler Inhale 2 puffs into the lungs every 6 (six) hours as needed for  wheezing or shortness of breath. 06/25/20  Yes Ria Bush, MD  nicotine (NICODERM CQ) 14 mg/24hr patch Place 1 patch (14 mg total) onto the skin daily. 06/25/20  Yes Ria Bush, MD  tiotropium (SPIRIVA HANDIHALER) 18 MCG inhalation capsule Place 1 capsule (18 mcg total) into inhaler and inhale daily. 06/25/20  Yes Ria Bush, MD     Family History  Problem Relation Age of Onset   Diabetes Mother    Stroke Mother 57       x3   Aneurysm Father        thoracic aortic aneurysm rupture   Diabetes Brother    Diabetes Brother    Cancer Neg Hx    CAD Neg Hx     Social History   Socioeconomic History   Marital status: Married    Spouse name: Not on file   Number of children: Not on file   Years of education: Not on file   Highest education level: Not on file  Occupational History   Not on file  Tobacco Use   Smoking status: Former Smoker    Packs/day: 1.00    Years: 50.00    Pack years: 50.00    Types: Cigarettes    Start date: 12/01/1963    Quit date: 06/24/2020    Years since quitting: 0.0   Smokeless tobacco: Never Used   Tobacco comment: stopped 06/24/20  Vaping Use   Vaping Use: Never used  Substance and Sexual Activity   Alcohol use: Not Currently    Comment: Regular-daily beer and wine   Drug use: No   Sexual activity:  Not on file  Other Topics Concern   Not on file  Social History Narrative   Lives with wife, daughter and granddaughter   Occupation: Associate Professor   Edu: HS   Activity: fishes   Diet: poor water, poor fruits/vegetables   Social Determinants of Health   Financial Resource Strain:    Difficulty of Paying Living Expenses:   Food Insecurity:    Worried About Charity fundraiser in the Last Year:    Arboriculturist in the Last Year:   Transportation Needs:    Film/video editor (Medical):    Lack of Transportation (Non-Medical):   Physical Activity:    Days of Exercise per Week:    Minutes of  Exercise per Session:   Stress:    Feeling of Stress :   Social Connections:    Frequency of Communication with Friends and Family:    Frequency of Social Gatherings with Friends and Family:    Attends Religious Services:    Active Member of Clubs or Organizations:    Attends Archivist Meetings:    Marital Status:     Review of Systems: A 12 point ROS discussed and pertinent positives are indicated in the HPI above.  All other systems are negative.  Review of Systems  Constitutional: Positive for appetite change and fatigue.  Respiratory: Positive for shortness of breath.   Cardiovascular: Negative for chest pain and leg swelling.  Gastrointestinal: Positive for abdominal distention and constipation. Negative for nausea and vomiting.  Musculoskeletal: Negative for back pain.  Neurological: Negative for headaches.    Vital Signs: BP 136/74    Pulse 84    Temp 97.7 F (36.5 C) (Oral)    Resp 20    Ht 5' 6.75" (1.695 m)    Wt 203 lb (92.1 kg)    SpO2 94%    BMI 32.03 kg/m   Physical Exam Constitutional:      General: He is not in acute distress.    Appearance: He is ill-appearing.  HENT:     Mouth/Throat:     Mouth: Mucous membranes are moist.     Pharynx: Oropharynx is clear.  Cardiovascular:     Rate and Rhythm: Normal rate and regular rhythm.     Pulses: Normal pulses.     Heart sounds: Normal heart sounds.  Pulmonary:     Effort: Pulmonary effort is normal.     Breath sounds: Normal breath sounds.  Abdominal:     General: Bowel sounds are normal.     Palpations: Abdomen is soft.     Tenderness: There is no abdominal tenderness.  Musculoskeletal:     Cervical back: Normal range of motion.  Skin:    General: Skin is warm and dry.  Neurological:     Mental Status: He is alert and oriented to person, place, and time.     Imaging: DG Chest 2 View  Result Date: 06/25/2020 CLINICAL DATA:  Dyspnea, smoker, COPD EXAM: CHEST - 2 VIEW COMPARISON:   08/29/2010 FINDINGS: The heart size and mediastinal contours are within normal limits. There is a small right pleural effusion with fluid in the minor fissure and a dense, masslike opacity of the perihilar right lung. Left lung is hyperinflated although normally aerated. The visualized skeletal structures are unremarkable. IMPRESSION: There is a small right pleural effusion with fluid in the minor fissure and a dense, masslike opacity of the perihilar right lung, findings concerning for malignancy, although infection is  a differential consideration. Recommend CT to further evaluate. These results will be called to the ordering clinician or representative by the Radiologist Assistant, and communication documented in the PACS or [employer]. Electronically Signed   By: Eddie Candle M.D.   On: 06/25/2020 09:54   CT Chest W Contrast  Result Date: 06/26/2020 CLINICAL DATA:  Abnormal chest radiograph. EXAM: CT CHEST WITH CONTRAST TECHNIQUE: Multidetector CT imaging of the chest was performed during intravenous contrast administration. CONTRAST:  64mL OMNIPAQUE IOHEXOL 300 MG/ML  SOLN COMPARISON:  Chest radiograph 06/25/2020. FINDINGS: Cardiovascular: Atherosclerotic calcification of the aorta and coronary arteries. Right upper lobe pulmonary artery is encased by a soft tissue mass. Heart size normal. No pericardial effusion. Mediastinum/Nodes: Low internal jugular lymph nodes are seen bilaterally, measuring up to 10 mm on the right (2/21). Conglomerate nodal mass in the low right paratracheal station measures 5.9 x 8.0 cm. Mass is contiguous with heterogeneous right hilar masslike soft tissue. There is obstruction of the right upper lobe bronchus. Left paratracheal lymph node measures 9 mm. No axillary adenopathy. Esophagus is grossly unremarkable. Lungs/Pleura: Centrilobular and paraseptal emphysema. Masslike consolidation in the anterior and posterior segments of the right upper lobe is difficult to measure and  is contiguous with conglomerate nodal right paratracheal/right hilar nodal mass. There are multiple bilateral pulmonary nodules. Index left upper lobe nodule measures 5 mm (3/57). Moderate right pleural effusion with extensive pleural nodularity at the base of the right hemithorax. Extrapleural lymph nodes in the inferior anterior right hemithorax measure up to 12 mm. Narrowing of the bronchus intermedius, in addition to obstruction of the right upper lobe bronchus. Upper Abdomen: Liver margin is irregular. There are multiple subtle heterogeneous low-attenuation masses bilaterally, measuring up to 2.1 x 3.5 cm, likely within segment 4 of the liver (2/143). Visualized portion of the gallbladder is unremarkable. Right adrenal mass measures 3.0 x 3.6 cm. Left adrenal nodule measures 2.1 x 2.1 cm. 10 mm low-attenuation lesion in the medial left kidney is too small to characterize. Kidneys and spleen are unremarkable. There may be a vague area of low attenuation in the pancreatic body, measuring 1.8 cm (2/167). Visualized portions of the stomach and bowel are grossly unremarkable. Upper abdominal adenopathy measures up to 2.7 cm in short axis in the gastrohepatic ligament. Musculoskeletal: Degenerative changes in the spine. No worrisome lytic or sclerotic lesions. IMPRESSION: 1. Stage IV lung cancer as evidenced by conglomerate right paratracheal/right hilar adenopathy and masslike consolidation in the right upper lobe with pleuroparenchymal, hepatic, adrenal and abdominal nodal metastases. 2. Vague area of low attenuation in the pancreatic body. Difficult to exclude metastatic disease. 3. Cirrhosis. 4. Aortic atherosclerosis (ICD10-I70.0). Coronary artery calcification. 5.  Emphysema (ICD10-J43.9). Electronically Signed   By: Lorin Picket M.D.   On: 06/26/2020 14:19    Labs:  CBC: Pending  COAGS: Pending  BMP: Recent Labs    06/18/20 0849  NA 138  K 4.7  CL 101  CO2 29  GLUCOSE 161*  BUN 10    CALCIUM 9.2  CREATININE 1.08    LIVER FUNCTION TESTS: Recent Labs    06/18/20 0849  BILITOT 0.6  AST 32  ALT 23  ALKPHOS 99  PROT 6.1  ALBUMIN 3.6    TUMOR MARKERS: No results for input(s): AFPTM, CEA, CA199, CHROMGRNA in the last 8760 hours.  Assessment and Plan:  Suspected lung cancer with possible metastasis: Guy Hester, 69 year old male, presents today for an image-guided biopsy of a right chest wall mass. The patient  presented to his PCP in July 2021 with shortness of breath and work up has uncovered possible metastatic lung cancer.   Risks and benefits of a right chest wall mass biopsy were discussed with the patient including, but not limited to bleeding, infection, damage to adjacent structures or low yield requiring additional tests.  All of the questions were answered and there is agreement to proceed.  The patient had two cups of coffee around 4:30/5 am. MD aware. Vitals have been reviewed. Labwork pending but will be reviewed prior to the start of the procedure.The patient does not take any blood thinning medications.   Consent signed and in chart.  Thank you for this interesting consult.  I greatly enjoyed meeting Rosalie Gelpi and look forward to participating in their care.  A copy of this report was sent to the requesting provider on this date.  Electronically Signed: Soyla Dryer, AGACNP-BC [phone] 07/03/2020, 7:22 AM   I spent a total of  30 Minutes   in face to face in clinical consultation, greater than 50% of which was counseling/coordinating care for an image-guided biopsy of right chest wall mass.

## 2020-07-03 ENCOUNTER — Encounter (HOSPITAL_COMMUNITY): Payer: Self-pay

## 2020-07-03 ENCOUNTER — Ambulatory Visit (HOSPITAL_COMMUNITY)
Admission: RE | Admit: 2020-07-03 | Discharge: 2020-07-03 | Disposition: A | Discharge status: Home / Self Care | Payer: Medicare Other | Source: Ambulatory Visit | Attending: Pulmonary Disease | Admitting: Pulmonary Disease

## 2020-07-03 ENCOUNTER — Other Ambulatory Visit: Payer: Self-pay

## 2020-07-03 DIAGNOSIS — R160 Hepatomegaly, not elsewhere classified: Secondary | ICD-10-CM | POA: Diagnosis not present

## 2020-07-03 DIAGNOSIS — R222 Localized swelling, mass and lump, trunk: Secondary | ICD-10-CM | POA: Insufficient documentation

## 2020-07-03 DIAGNOSIS — C3492 Malignant neoplasm of unspecified part of left bronchus or lung: Secondary | ICD-10-CM | POA: Diagnosis not present

## 2020-07-03 DIAGNOSIS — C761 Malignant neoplasm of thorax: Secondary | ICD-10-CM | POA: Diagnosis not present

## 2020-07-03 HISTORY — DX: Chronic obstructive pulmonary disease, unspecified: J44.9

## 2020-07-03 HISTORY — DX: Dyspnea, unspecified: R06.00

## 2020-07-03 LAB — CBC
HCT: 45.8 % (ref 39.0–52.0)
Hemoglobin: 15.1 g/dL (ref 13.0–17.0)
MCH: 30 pg (ref 26.0–34.0)
MCHC: 33 g/dL (ref 30.0–36.0)
MCV: 90.9 fL (ref 80.0–100.0)
Platelets: 319 10*3/uL (ref 150–400)
RBC: 5.04 MIL/uL (ref 4.22–5.81)
RDW: 12.2 % (ref 11.5–15.5)
WBC: 11.2 10*3/uL — ABNORMAL HIGH (ref 4.0–10.5)
nRBC: 0 % (ref 0.0–0.2)

## 2020-07-03 LAB — PROTIME-INR
INR: 1 (ref 0.8–1.2)
Prothrombin Time: 12.5 seconds (ref 11.4–15.2)

## 2020-07-03 MED ORDER — MIDAZOLAM HCL 2 MG/2ML IJ SOLN
INTRAMUSCULAR | Status: AC | PRN
  Administered 2020-07-03: 1 mg via INTRAVENOUS
  Administered 2020-07-03: 0.5 mg via INTRAVENOUS

## 2020-07-03 MED ORDER — MIDAZOLAM HCL 2 MG/2ML IJ SOLN
INTRAMUSCULAR | Status: AC
  Filled 2020-07-03: qty 2

## 2020-07-03 MED ORDER — SODIUM CHLORIDE 0.9 % IV SOLN
INTRAVENOUS | Status: AC | PRN
  Administered 2020-07-03: 10 mL/h via INTRAVENOUS

## 2020-07-03 MED ORDER — SODIUM CHLORIDE 0.9 % IV SOLN: INTRAVENOUS | Status: DC

## 2020-07-03 NOTE — Discharge Instructions (Addendum)

## 2020-07-03 NOTE — Procedures (Signed)
Interventional Radiology Procedure Note  Procedure: Image guided left chest wall mass biopsy.  Mx 16g core.  Complications: None Recommendations:  - Ok to shower tomorrow - Do not submerge for 7 days - Routine wound care - dc 1 hr   Signed,  Dulcy Fanny. Earleen Newport, DO

## 2020-07-04 LAB — SURGICAL PATHOLOGY

## 2020-07-09 ENCOUNTER — Telehealth: Payer: Self-pay | Admitting: Pulmonary Disease

## 2020-07-09 ENCOUNTER — Inpatient Hospital Stay (HOSPITAL_COMMUNITY): Admission: RE | Admit: 2020-07-09 | Payer: Medicare Other | Source: Ambulatory Visit

## 2020-07-09 DIAGNOSIS — C3490 Malignant neoplasm of unspecified part of unspecified bronchus or lung: Secondary | ICD-10-CM

## 2020-07-09 NOTE — Telephone Encounter (Signed)
Referral in the system for oncology appt to be scheduled.

## 2020-07-09 NOTE — Telephone Encounter (Signed)
I spoke with wife and updated on diangosis.  Have signed the referral. Please check if it went through. Thanks

## 2020-07-09 NOTE — Telephone Encounter (Addendum)
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  He will need referral to oncology I have called and left messages on the phone for both patient and his wife requesting a call back to discuss results.

## 2020-07-09 NOTE — Telephone Encounter (Signed)
I have pended the referral- please let us know when you speak with the pt and we can sign referral

## 2020-07-10 ENCOUNTER — Encounter: Payer: Self-pay | Admitting: *Deleted

## 2020-07-10 ENCOUNTER — Telehealth: Payer: Self-pay | Admitting: Physician Assistant

## 2020-07-10 ENCOUNTER — Telehealth: Payer: Self-pay | Admitting: Pulmonary Disease

## 2020-07-10 NOTE — Progress Notes (Signed)
I received referral on Mr. Goehring today.  I notified new patient scheduler to call and schedule with Cassi PA 07/16/20 with labs

## 2020-07-10 NOTE — Telephone Encounter (Signed)
Received a new pt referral from Dr. Vaughan Browner at [facility] for squamous cell carcinoma of the lung. Mr. Rawl has been scheduled to see Cassie on 8/17 at 130pm w/labs at 1pm. I cld and left the appt date and time on the pt's vm. Thoracic navigator has been updated.

## 2020-07-10 NOTE — Telephone Encounter (Signed)
Called and spoke with Patient's Wife, Mickel Baas (DPR). Mickel Baas asked what kind of cancer Patient had, and about scheduling oncology appointment. Elita Boone that oncology referral was placed, and someone with oncology would contact them to schedule appointment. Read out to Mickel Baas Dr. Matilde Bash biopsy result on 07/09/20 encounter.  Understanding stated.  Nothing further at this time.    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  He will need referral to oncology I have called and left messages on the phone for both patient and his wife requesting a call back to discuss results.

## 2020-07-10 NOTE — Telephone Encounter (Signed)
Received a call from the pt's wife confirming his appt w/Cassie on 8/17.

## 2020-07-13 ENCOUNTER — Other Ambulatory Visit: Payer: Self-pay | Admitting: Physician Assistant

## 2020-07-13 DIAGNOSIS — R918 Other nonspecific abnormal finding of lung field: Secondary | ICD-10-CM

## 2020-07-14 NOTE — Progress Notes (Signed)
[place] [phone]   [phone]  CONSULT NOTE  REFERRING PHYSICIAN: Dr. Vaughan Browner  REASON FOR CONSULTATION:  Small Cell Lung Cancer  HPI Guy Hester is a 69 y.o. male with a past medical history significant for COPD, diabetes mellitus, alcohol abuse, tobacco abuse, and varicose veins is referred to the clinic for evaluation of newly diagnosed small cell lung cancer  The patient's evaluation began on 7/27 after presenting to his PCP for the chief complaint of worsening dyspnea for 6-7 months. The patient had a CXR performed that day which showed a small right pleural effusion with a dense, masslike opacity of the perihilar right lung. The patient had a CT scan if the chest the following day on 7/28 which showed stage IV lung cancer as evidenced by conglomerate right paratracheal/right hilar adenopathy and masslike consolidation in the right upper lobe with pleuroparenchymal, hepatic, adrenal and abdominal nodal metastases. There was also a vague area of low attenuation in the pancreatic body. Difficult to exclude metastatic disease.  The patient then underwent a CT guided biopsy of the mass on 07/03/20 and the final pathology ([id number]) was consistent with small cell lung cancer.   Overall, the patient is feeling unwell.  The patient has been experiencing significant fatigue, decreased appetite, and dyspnea.  He estimates that he lost approximately 20 pounds or so in the last 3 months.  The patient's wife has been trying to encourage him to drink Ensure.  He drinks approximately half of an Ensure per day.  He denies any fever, chills, or night sweats.  He states that his cough has improved but occasionally has wheezing as well as chest discomfort if coughing.  He denies any nausea, vomiting, or diarrhea.  He states that he has not had a " normal" bowel movement in at least 3 weeks or so.  He has some abdominal pain for the last several weeks.  He  denies any headache or visual changes but reports that he has been falling frequently secondary to bilateral lower extremity weakness.  He denies any seizure activity.  Patient also states that he has been hallucinating over the last week or so.  He states that he sees door handles that he reaches for them to turn and that they are not actually there.  No tremors present.   The patient's family history consist of a mother who passed away secondary to diabetes complications.  The patient's father had an aneurysm and obesity.  The patient has 2 brothers 1 of which that has diabetes.   The patient worked as a Audiological scientist.  The patient is married.  He has 3 biological children from a prior relationship.  The patient used to drink approximately a 12 pack of beer ~ daily.  He states that he stopped drinking alcohol 2 weeks ago.  The patient used to do recreational drugs including methamphetamine which he quit approximately 10 to 20 years ago.  He also smokes marijuana but has quit as well.  The patient estimates that he smoked approximately 40 years averaging 1 pack/day. HPI  Past Medical History:  Diagnosis Date  . COPD (chronic obstructive pulmonary disease) ([facility])   . Dyspnea   . Heavy alcohol use   . History of asthma    childhood  . History of chicken pox   . Smoker     Past Surgical History:  Procedure Laterality Date  . INGUINAL HERNIA REPAIR Right 2010  . VARICOSE VEIN SURGERY Right  laser    Family History  Problem Relation Age of Onset  . Diabetes Mother   . Stroke Mother 23       x3  . Aneurysm Father        thoracic aortic aneurysm rupture  . Diabetes Brother   . Diabetes Brother   . Cancer Neg Hx   . CAD Neg Hx     Social History Social History   Tobacco Use  . Smoking status: Former Smoker    Packs/day: 1.00    Years: 50.00    Pack years: 50.00    Types: Cigarettes    Start date: 12/01/1963    Quit date: 06/24/2020    Years since quitting: 0.0  .  Smokeless tobacco: Never Used  . Tobacco comment: stopped 06/24/20  Vaping Use  . Vaping Use: Never used  Substance Use Topics  . Alcohol use: Not Currently    Comment: Regular-daily beer and wine  . Drug use: No    No Known Allergies  Current Outpatient Medications  Medication Sig Dispense Refill  . albuterol (VENTOLIN HFA) 108 (90 Base) MCG/ACT inhaler Inhale 2 puffs into the lungs every 6 (six) hours as needed for wheezing or shortness of breath. 18 g 3  . nicotine (NICODERM CQ) 14 mg/24hr patch Place 1 patch (14 mg total) onto the skin daily. 28 patch 1  . oxyCODONE (ROXICODONE) 5 MG immediate release tablet Take 1 tablet (5 mg total) by mouth every 4 (four) hours as needed for severe pain. 30 tablet 0  . prochlorperazine (COMPAZINE) 10 MG tablet Take 1 tablet (10 mg total) by mouth every 6 (six) hours as needed. 30 tablet 2  . tiotropium (SPIRIVA HANDIHALER) 18 MCG inhalation capsule Place 1 capsule (18 mcg total) into inhaler and inhale daily. 30 capsule 12   No current facility-administered medications for this visit.   REVIEW OF SYSTEMS:   Review of Systems  Constitutional: Positive for fatigue, appetite change, and unexplained weight loss. Negative for chills and fever. HENT: Negative for mouth sores, nosebleeds, sore throat and trouble swallowing.   Eyes: Negative for eye problems and icterus.  Respiratory: Positive for shortness of breath. Negative for cough, hemoptysis, and wheezing.   Cardiovascular: Negative for chest pain and leg swelling.  Gastrointestinal: Positive for abdominal pain. Positive for constipation? Negative for diarrhea, nausea and vomiting.  Genitourinary: Negative for bladder incontinence, difficulty urinating, dysuria, frequency and hematuria.   Musculoskeletal: Negative for back pain, gait problem, neck pain and neck stiffness.  Skin: Negative for itching and rash.  Neurological: Positive for generalized weakness. Negative for dizziness, extremity  weakness, gait problem, headaches, light-headedness and seizures.  Hematological: Negative for adenopathy. Does not bruise/bleed easily.  Psychiatric/Behavioral: Negative for confusion, depression and sleep disturbance. The patient is not nervous/anxious.     PHYSICAL EXAMINATION:  Blood pressure 124/82, pulse (!) 113, temperature 97.9 F (36.6 C), temperature source Tympanic, resp. rate 18, height 5\' 6"  (1.676 m), weight 191 lb (86.6 kg), SpO2 97 %.  ECOG PERFORMANCE STATUS: 3  Physical Exam  Constitutional: Oriented to person, place, and time and chronically ill-appearing and in no distress.  HENT:  Head: Normocephalic and atraumatic.  Mouth/Throat: Oropharynx is clear and moist. No oropharyngeal exudate.  Eyes: Conjunctivae are normal. Right eye exhibits no discharge. Left eye exhibits no discharge. No scleral icterus.  Neck: Normal range of motion. Neck supple.  Cardiovascular: Tachycardic, regular rhythm, normal heart sounds and intact distal pulses.   Pulmonary/Chest: Effort normal and breath  sounds normal. No respiratory distress. No wheezes. No rales.  Abdominal: Soft.  Tenderness to palpation in the right upper quadrant.  bowel sounds are normal. Exhibits no distension and no mass. Musculoskeletal: Normal range of motion. Exhibits no edema.  Lymphadenopathy:    No cervical adenopathy.  Neurological: Alert and oriented to person, place, and time. Exhibits muscle wasting.  The patient was examined in the wheelchair. Skin: Skin is warm and dry. No rash noted. Not diaphoretic. No erythema. No pallor.  Psychiatric: Mood, memory and judgment normal.  Vitals reviewed.  LABORATORY DATA: Lab Results  Component Value Date   WBC 20.0 (H) 07/16/2020   HGB 14.3 07/16/2020   HCT 42.7 07/16/2020   MCV 89.5 07/16/2020   PLT 418 (H) 07/16/2020      Chemistry      Component Value Date/Time   NA 138 06/18/2020 0849   K 4.7 06/18/2020 0849   CL 101 06/18/2020 0849   CO2 29 06/18/2020  0849   BUN 10 06/18/2020 0849   CREATININE 1.08 06/18/2020 0849      Component Value Date/Time   CALCIUM 9.2 06/18/2020 0849   ALKPHOS 99 06/18/2020 0849   AST 32 06/18/2020 0849   ALT 23 06/18/2020 0849   BILITOT 0.6 06/18/2020 0849       RADIOGRAPHIC STUDIES: DG Chest 2 View  Result Date: 06/25/2020 CLINICAL DATA:  Dyspnea, smoker, COPD EXAM: CHEST - 2 VIEW COMPARISON:  08/29/2010 FINDINGS: The heart size and mediastinal contours are within normal limits. There is a small right pleural effusion with fluid in the minor fissure and a dense, masslike opacity of the perihilar right lung. Left lung is hyperinflated although normally aerated. The visualized skeletal structures are unremarkable. IMPRESSION: There is a small right pleural effusion with fluid in the minor fissure and a dense, masslike opacity of the perihilar right lung, findings concerning for malignancy, although infection is a differential consideration. Recommend CT to further evaluate. These results will be called to the ordering clinician or representative by the Radiologist Assistant, and communication documented in the PACS or [employer]. Electronically Signed   By: Eddie Candle M.D.   On: 06/25/2020 09:54   CT Chest W Contrast  Result Date: 06/26/2020 CLINICAL DATA:  Abnormal chest radiograph. EXAM: CT CHEST WITH CONTRAST TECHNIQUE: Multidetector CT imaging of the chest was performed during intravenous contrast administration. CONTRAST:  77mL OMNIPAQUE IOHEXOL 300 MG/ML  SOLN COMPARISON:  Chest radiograph 06/25/2020. FINDINGS: Cardiovascular: Atherosclerotic calcification of the aorta and coronary arteries. Right upper lobe pulmonary artery is encased by a soft tissue mass. Heart size normal. No pericardial effusion. Mediastinum/Nodes: Low internal jugular lymph nodes are seen bilaterally, measuring up to 10 mm on the right (2/21). Conglomerate nodal mass in the low right paratracheal station measures 5.9 x 8.0 cm. Mass  is contiguous with heterogeneous right hilar masslike soft tissue. There is obstruction of the right upper lobe bronchus. Left paratracheal lymph node measures 9 mm. No axillary adenopathy. Esophagus is grossly unremarkable. Lungs/Pleura: Centrilobular and paraseptal emphysema. Masslike consolidation in the anterior and posterior segments of the right upper lobe is difficult to measure and is contiguous with conglomerate nodal right paratracheal/right hilar nodal mass. There are multiple bilateral pulmonary nodules. Index left upper lobe nodule measures 5 mm (3/57). Moderate right pleural effusion with extensive pleural nodularity at the base of the right hemithorax. Extrapleural lymph nodes in the inferior anterior right hemithorax measure up to 12 mm. Narrowing of the bronchus intermedius, in addition to  obstruction of the right upper lobe bronchus. Upper Abdomen: Liver margin is irregular. There are multiple subtle heterogeneous low-attenuation masses bilaterally, measuring up to 2.1 x 3.5 cm, likely within segment 4 of the liver (2/143). Visualized portion of the gallbladder is unremarkable. Right adrenal mass measures 3.0 x 3.6 cm. Left adrenal nodule measures 2.1 x 2.1 cm. 10 mm low-attenuation lesion in the medial left kidney is too small to characterize. Kidneys and spleen are unremarkable. There may be a vague area of low attenuation in the pancreatic body, measuring 1.8 cm (2/167). Visualized portions of the stomach and bowel are grossly unremarkable. Upper abdominal adenopathy measures up to 2.7 cm in short axis in the gastrohepatic ligament. Musculoskeletal: Degenerative changes in the spine. No worrisome lytic or sclerotic lesions. IMPRESSION: 1. Stage IV lung cancer as evidenced by conglomerate right paratracheal/right hilar adenopathy and masslike consolidation in the right upper lobe with pleuroparenchymal, hepatic, adrenal and abdominal nodal metastases. 2. Vague area of low attenuation in the  pancreatic body. Difficult to exclude metastatic disease. 3. Cirrhosis. 4. Aortic atherosclerosis (ICD10-I70.0). Coronary artery calcification. 5.  Emphysema (ICD10-J43.9). Electronically Signed   By: Lorin Picket M.D.   On: 06/26/2020 14:19   CT BIOPSY  Result Date: 07/03/2020 INDICATION: 69 year old male with a history of likely lung cancer with metastases referred for biopsy EXAM: IMAGE GUIDED BIOPSY LEFT CHEST WALL MASS MEDICATIONS: None. ANESTHESIA/SEDATION: Moderate (conscious) sedation was employed during this procedure. A total of Versed 1.5 mg and Fentanyl 0 mcg was administered intravenously. Moderate Sedation Time: 0 minutes. The patient's level of consciousness and vital signs were monitored continuously by radiology nursing throughout the procedure under my direct supervision. FLUOROSCOPY TIME:  CT COMPLICATIONS: None PROCEDURE: Informed written consent was obtained from the patient after a thorough discussion of the procedural risks, benefits and alternatives. All questions were addressed. Maximal Sterile Barrier Technique was utilized including caps, mask, sterile gowns, sterile gloves, sterile drape, hand hygiene and skin antiseptic. A timeout was performed prior to the initiation of the procedure. Patient positioned supine position on the CT gantry table. Scout CT acquired for planning purposes. Once we confirmed the left chest wall lesion, the correlate was identified with ultrasound. The patient was then prepped and draped in the usual sterile fashion. 1% lidocaine was used for local anesthesia. Ultrasound guidance was used then 4 four separate 16 gauge core biopsy. Tissue placed into formalin. Sterile bandage was placed. Patient tolerated the procedure well and remained hemodynamically stable throughout. No complications were encountered and no significant blood loss. IMPRESSION: Status post image guided biopsy of left chest wall mass. Tissue specimen sent to pathology for complete  histopathologic analysis. Signed, Dulcy Fanny. Dellia Nims, RPVI [facility] Electronically Signed   By: Corrie Mckusick D.O.   On: 07/03/2020 12:02    ASSESSMENT: This is a very pleasant 72 year oldCaucasian male diagnosed with extensive stage small cell lung cancer. He presented with conglomerate right paratracheal/right hilar adenopathy and masslike consolidation in the right upper lobe with pleuroparenchymal, hepatic, adrenal and abdominal nodal metastases. Pending further staging work up with a brain MRI and PET scan. Diagnosed in August 2021   PLAN: Dr. Julien Nordmann had a lengthly discussion with the patient today about his current condition and treatment options. The patient was given the option of a referral to hospice/palliative vs. Treatment with palliative systemic chemotherapy with carboplatin for an AUC of 5 on day 1, etoposide 100 mg/m2 on days 1, 2, and 3 ,  and Imfinzi 1500 mg IV every 3 weeks.  The patient is interested in proceeding with systemic chemotherapy but may call us back if he changes his mind.  He is expected to start the first dose of this treatment on 07/22/20  We discussed the adverse side effects of treatment including but not limited to alopecia, myelosuppression, nausea and vomiting, peripheral neuropathy, liver or renal dysfunction as well as immunotherapy mediated adverse effects.   I will arrange for the patient to have a chemoeducation class prior to receiving is first cycle of chemotherapy.   I sent a prescription for Compazine 10 mg every 6 hours as needed for nausea. I have also sent a prescription for oxycodone 5 mg every 6 hours as needed for pain. I provided the patient a hand out on constipation education and encouraged him to use stool softeners/laxatives to prevent constipation.   I have ordered the patient's brain MRI and PET scan.   We will refer the patient to radiation oncology for consideration of  palliative radiotherapy to help with his shortness of breath. His oxygen saturation is 97%. He is afebrile.   The patient will follow-up in 2 weeks for a one-week follow-up visit after completing his first cycle of chemotherapy.   The patient voices understanding of current disease status and treatment options and is in agreement with the current care plan.  All questions were answered. The patient knows to call the clinic with any problems, questions or concerns. We can certainly see the patient much sooner if necessary.  Thank you so much for allowing me to participate in the care of Serita Grammes. I will continue to follow up the patient with you and assist in his care.  The total time spent in the appointment was 90 minutes.  Disclaimer: This note was dictated with voice recognition software. Similar sounding words can inadvertently be transcribed and may not be corrected upon review.   Jarnell Cordaro L Severo Beber July 16, 2020, 3:06 PM  ADDENDUM: Hematology/Oncology Attending: I had a face-to-face encounter with the patient today.  I recommended his care plan.  This is a 69 years old white male with history of alcohol, tobacco and drug abuse in the past.  The patient was seen by his primary care physician in late July 2021 complaining of worsening dyspnea for several months.  During his evaluation he had chest x-ray that showed a small right pleural effusion with a dense masslike opacity in the perihilar right lung.  The patient had CT scan of the chest with contrast on 06/26/2020 and it showed masslike consolidation in the anterior and posterior segments of the right upper lobe difficult to measure and contagious with conglomerate nodal right paratracheal/right hilar nodal mass with multiple bilateral pulmonary nodules and moderate right pleural effusion with extensive pleural nodularity at the base of the right hemithorax and extrapleural lymph nodes in the inferior anterior right  hemithorax measuring up to 1.2 cm in addition to narrowing of the bronchus intermedius and obstruction of the right upper lobe bronchus.  The scan also showed multiple supple heterogeneous low-attenuation masses bilaterally in the liver measuring up to 2.1 x 3.5 cm.  There was also a right adrenal mass measuring 3.0 x 3.6 cm and left adrenal nodule measuring 2.1 x 2.1 cm.  The patient was seen by Dr. Vaughan Browner and he ordered a CT-guided core biopsy of the left chest wall mass by interventional radiology. The final pathology [id number]) was consistent with small cell carcinoma. Dr. Vaughan Browner kindly referred  the patient to [place] today for evaluation and recommendation regarding treatment of his condition. I had a lengthy discussion with the patient and his wife today about his current condition and treatment options. The patient has extensive stage (T3, N2, M1c) small cell lung cancer.  I explained to the patient that he has incurable condition and all the treatment will be of palliative nature. I discussed with the patient his prognosis with and without treatment.  He understands that without treatment his median overall survival is in the range of 3 months and with treatment the median survival is around 12-13 months. I gave the patient the option of palliative care versus palliative systemic chemotherapy with carboplatin for AUC of 5 on day 1, Imfinzi 1500 mg/M2 on day 1 and etoposide 100 mg/M2 on days 1, 2 and 3 with Cosela infusion before the chemotherapy for Myeloprotection. I discussed with the patient the adverse effect of this treatment including but not limited to alopecia, myelosuppression, nausea and vomiting, peripheral neuropathy, liver or renal dysfunction as well as the adverse effect of the immunotherapy. The patient is interested in proceeding with the treatment and I will schedule him to start the first dose of this treatment next week. We will complete the staging work-up by ordering a PET scan  as well as MRI of the brain. We will arrange for the patient to have a chemotherapy education class before the first dose of his treatment. We will call his pharmacy with prescription for Compazine 10 mg p.o. every 6 hours as needed for nausea. The patient will come back for follow-up visit in 2 weeks for evaluation and management of any adverse effect of his treatment. He was advised to call immediately if he has any concerning symptoms in the interval.  Disclaimer: This note was dictated with voice recognition software. Similar sounding words can inadvertently be transcribed and may be missed upon review. Eilleen Kempf, MD 07/16/20

## 2020-07-16 ENCOUNTER — Other Ambulatory Visit: Payer: Self-pay | Admitting: Internal Medicine

## 2020-07-16 ENCOUNTER — Inpatient Hospital Stay: Payer: Medicare Other | Attending: Physician Assistant | Admitting: Physician Assistant

## 2020-07-16 ENCOUNTER — Other Ambulatory Visit: Payer: Self-pay

## 2020-07-16 ENCOUNTER — Inpatient Hospital Stay: Payer: Medicare Other

## 2020-07-16 ENCOUNTER — Encounter: Payer: Self-pay | Admitting: Physician Assistant

## 2020-07-16 VITALS — BP 124/82 | HR 113 | Temp 97.9°F | Resp 18 | Ht 66.0 in | Wt 191.0 lb

## 2020-07-16 DIAGNOSIS — R5383 Other fatigue: Secondary | ICD-10-CM | POA: Diagnosis not present

## 2020-07-16 DIAGNOSIS — E119 Type 2 diabetes mellitus without complications: Secondary | ICD-10-CM | POA: Insufficient documentation

## 2020-07-16 DIAGNOSIS — Z7189 Other specified counseling: Secondary | ICD-10-CM

## 2020-07-16 DIAGNOSIS — Z87891 Personal history of nicotine dependence: Secondary | ICD-10-CM | POA: Diagnosis not present

## 2020-07-16 DIAGNOSIS — C772 Secondary and unspecified malignant neoplasm of intra-abdominal lymph nodes: Secondary | ICD-10-CM | POA: Diagnosis not present

## 2020-07-16 DIAGNOSIS — C7970 Secondary malignant neoplasm of unspecified adrenal gland: Secondary | ICD-10-CM

## 2020-07-16 DIAGNOSIS — C787 Secondary malignant neoplasm of liver and intrahepatic bile duct: Secondary | ICD-10-CM | POA: Insufficient documentation

## 2020-07-16 DIAGNOSIS — Z720 Tobacco use: Secondary | ICD-10-CM | POA: Diagnosis not present

## 2020-07-16 DIAGNOSIS — C3411 Malignant neoplasm of upper lobe, right bronchus or lung: Secondary | ICD-10-CM | POA: Insufficient documentation

## 2020-07-16 DIAGNOSIS — G893 Neoplasm related pain (acute) (chronic): Secondary | ICD-10-CM

## 2020-07-16 DIAGNOSIS — F1090 Alcohol use, unspecified, uncomplicated: Secondary | ICD-10-CM

## 2020-07-16 DIAGNOSIS — C3490 Malignant neoplasm of unspecified part of unspecified bronchus or lung: Secondary | ICD-10-CM

## 2020-07-16 DIAGNOSIS — R5382 Chronic fatigue, unspecified: Secondary | ICD-10-CM

## 2020-07-16 DIAGNOSIS — C3491 Malignant neoplasm of unspecified part of right bronchus or lung: Secondary | ICD-10-CM

## 2020-07-16 DIAGNOSIS — Z789 Other specified health status: Secondary | ICD-10-CM

## 2020-07-16 DIAGNOSIS — R918 Other nonspecific abnormal finding of lung field: Secondary | ICD-10-CM

## 2020-07-16 LAB — CMP (CANCER CENTER ONLY)
ALT: 130 U/L — ABNORMAL HIGH (ref 0–44)
AST: 134 U/L — ABNORMAL HIGH (ref 15–41)
Albumin: 2.1 g/dL — ABNORMAL LOW (ref 3.5–5.0)
Alkaline Phosphatase: 192 U/L — ABNORMAL HIGH (ref 38–126)
Anion gap: 14 (ref 5–15)
BUN: 19 mg/dL (ref 8–23)
CO2: 19 mmol/L — ABNORMAL LOW (ref 22–32)
Calcium: 8.8 mg/dL — ABNORMAL LOW (ref 8.9–10.3)
Chloride: 100 mmol/L (ref 98–111)
Creatinine: 1.07 mg/dL (ref 0.61–1.24)
GFR, Est AFR Am: >60 mL/min (ref >60)
GFR, Estimated: >60 mL/min (ref >60)
Glucose, Bld: 231 mg/dL — ABNORMAL HIGH (ref 70–99)
Potassium: 4.6 mmol/L (ref 3.5–5.1)
Sodium: 133 mmol/L — ABNORMAL LOW (ref 135–145)
Total Bilirubin: 1.1 mg/dL (ref 0.3–1.2)
Total Protein: 5.9 g/dL — ABNORMAL LOW (ref 6.5–8.1)

## 2020-07-16 LAB — CBC WITH DIFFERENTIAL (CANCER CENTER ONLY)
Abs Immature Granulocytes: 0.37 10*3/uL — ABNORMAL HIGH (ref 0.00–0.07)
Basophils Absolute: 0 10*3/uL (ref 0.0–0.1)
Basophils Relative: 0 %
Eosinophils Absolute: 0 10*3/uL (ref 0.0–0.5)
Eosinophils Relative: 0 %
HCT: 42.7 % (ref 39.0–52.0)
Hemoglobin: 14.3 g/dL (ref 13.0–17.0)
Immature Granulocytes: 2 %
Lymphocytes Relative: 7 %
Lymphs Abs: 1.3 10*3/uL (ref 0.7–4.0)
MCH: 30 pg (ref 26.0–34.0)
MCHC: 33.5 g/dL (ref 30.0–36.0)
MCV: 89.5 fL (ref 80.0–100.0)
Monocytes Absolute: 1.1 10*3/uL — ABNORMAL HIGH (ref 0.1–1.0)
Monocytes Relative: 6 %
Neutro Abs: 17.1 10*3/uL — ABNORMAL HIGH (ref 1.7–7.7)
Neutrophils Relative %: 85 %
Platelet Count: 418 10*3/uL — ABNORMAL HIGH (ref 150–400)
RBC: 4.77 MIL/uL (ref 4.22–5.81)
RDW: 12.7 % (ref 11.5–15.5)
WBC Count: 20 10*3/uL — ABNORMAL HIGH (ref 4.0–10.5)
nRBC: 0 % (ref 0.0–0.2)

## 2020-07-16 MED ORDER — OXYCODONE HCL 5 MG PO TABS: 5.0000 mg | ORAL_TABLET | ORAL | 0 refills | Status: DC | PRN

## 2020-07-16 MED ORDER — OXYCODONE HCL 5 MG PO TABS: 5.0000 mg | ORAL_TABLET | Freq: Four times a day (QID) | ORAL | 0 refills | Status: AC | PRN

## 2020-07-16 MED ORDER — PROCHLORPERAZINE MALEATE 10 MG PO TABS: 10.0000 mg | ORAL_TABLET | Freq: Four times a day (QID) | ORAL | 2 refills | Status: AC | PRN

## 2020-07-16 NOTE — Patient Instructions (Addendum)
-There are two main categories of lung cancer, they are named based on the size of the cancer cell. One is called Non-Small cell lung cancer. The other type is Small Cell Lung Cancer -The sample (biopsy) that they took of your tumor was consistent with Small Cell Lung Cancer -We covered a lot of important information at your appointment today regarding what the treatment plan is moving forward. Here are the the main points that were discussed at your office visit with [place] today:  -The treatment that you will receive consists of Two chemotherapy drugs, called Carboplatin and Etoposide and One Immunotherapy drug called Imfinzi (Durvalumab)  -We are planning on starting your treatment next week on 07/22/20 but before your start your treatment, I would like you to attend a Chemotherapy Education Class. This involves having you sit down with one of our nurse educators. She will discuss with your one-on-one more details about your treatment as well as general information about resources here at the [facility] treatment will be given for three consecutive days every 3 weeks.  We will check your labs once a week for the first ~4 treatments just to make sure that important components of your blood are in an acceptable range -We will get a CT scan after 2 treatments to check on the progress of treatment  Medications:  -I have sent a few important medication prescriptions to your pharmacy.  -Compazine was sent to your pharmacy. This medication is for nausea. You may take this every 6 hours as needed if you feel nausous. .   Side Effects:  -The adverse effect of this treatment including but not limited to alopecia (losing your hair), myelosuppression (drops in the blood counts), nausea and vomiting, peripheral neuropathy (numbness and tingling in the hands and feet), liver or renal dysfunction.   Referrals:  -I will refer him to radiation oncology to see if they can give radiation to the mass to help  you breath better.   Imaging:  -I have placed an order for a brain MRI as well as a PET scan. This will help [place] make sure we are checking for all the placed involved so we know how to best treat you. If you do not hear from the radiology scheduling department, please call [phone]. All you need to do is tell them your name and date of birth and that you are calling to schedule your scan.    Follow up:  -We will see you back for a follow up visit in 2 week. If you change your mind, please call us. Our number is [phone]   It is common for patients who are undergoing treatment and taking certain prescribed medications to experience side-effects with constipation.  If you experience constipation, please take stool softener such as Colace or Senna one tablet twice a day everyday to avoid constipation.  These medications are available over the counter.  Of course, if you have diarrhea, stop taking stool softeners.  Drinking plenty of fluid, eating fruits and vegetable, and being active also reduces the risk of constipation.   If despite taking stool softeners, and you still have no bowel movement for 2 days or more than your normal bowel habit frequency, please take one of the following over the counter laxatives:  MiraLax, Milk of Magnesia or Mag Citrate everyday and contact me immediately for further instructions.  The goal is to have at least one bowel movement every other day.   Durvalumab injection What is this  medicine? DURVALUMAB (dur VAL ue mab) is a monoclonal antibody. It is used to treat urothelial cancer and lung cancer. This medicine may be used for other purposes; ask your health care provider or pharmacist if you have questions. COMMON BRAND NAME(S): IMFINZI What should I tell my health care provider before I take this medicine? They need to know if you have any of these conditions:  diabetes  immune system problems  infection  inflammatory bowel disease  kidney  disease  liver disease  lung or breathing disease  lupus  organ transplant  stomach or intestine problems  thyroid disease  an unusual or allergic reaction to durvalumab, other medicines, foods, dyes, or preservatives  pregnant or trying to get pregnant  breast-feeding How should I use this medicine? This medicine is for infusion into a vein. It is given by a health care professional in a hospital or clinic setting. A special MedGuide will be given to you before each treatment. Be sure to read this information carefully each time. Talk to your pediatrician regarding the use of this medicine in children. Special care may be needed. Overdosage: If you think you have taken too much of this medicine contact a poison control center or emergency room at once. NOTE: This medicine is only for you. Do not share this medicine with others. What if I miss a dose? It is important not to miss your dose. Call your doctor or health care professional if you are unable to keep an appointment. What may interact with this medicine? Interactions have not been studied. This list may not describe all possible interactions. Give your health care provider a list of all the medicines, herbs, non-prescription drugs, or dietary supplements you use. Also tell them if you smoke, drink alcohol, or use illegal drugs. Some items may interact with your medicine. What should I watch for while using this medicine? This drug may make you feel generally unwell. Continue your course of treatment even though you feel ill unless your doctor tells you to stop. You may need blood work done while you are taking this medicine. Do not become pregnant while taking this medicine or for 3 months after stopping it. Women should inform their doctor if they wish to become pregnant or think they might be pregnant. There is a potential for serious side effects to an unborn child. Talk to your health care professional or pharmacist for  more information. Do not breast-feed an infant while taking this medicine or for 3 months after stopping it. What side effects may I notice from receiving this medicine? Side effects that you should report to your doctor or health care professional as soon as possible:  allergic reactions like skin rash, itching or hives, swelling of the face, lips, or tongue  black, tarry stools  bloody or watery diarrhea  breathing problems  change in emotions or moods  change in sex drive  changes in vision  chest pain or chest tightness  chills  confusion  cough  facial flushing  fever  headache  signs and symptoms of high blood sugar such as dizziness; dry mouth; dry skin; fruity breath; nausea; stomach pain; increased hunger or thirst; increased urination  signs and symptoms of liver injury like dark yellow or brown urine; general ill feeling or flu-like symptoms; light-colored stools; loss of appetite; nausea; right upper belly pain; unusually weak or tired; yellowing of the eyes or skin  stomach pain  trouble passing urine or change in the amount of urine  weight gain or weight loss Side effects that usually do not require medical attention (report these to your doctor or health care professional if they continue or are bothersome):  bone pain  constipation  loss of appetite  muscle pain  nausea  swelling of the ankles, feet, hands  tiredness This list may not describe all possible side effects. Call your doctor for medical advice about side effects. You may report side effects to FDA at 1-800-FDA-1088. Where should I keep my medicine? This drug is given in a hospital or clinic and will not be stored at home. NOTE: This sheet is a summary. It may not cover all possible information. If you have questions about this medicine, talk to your doctor, pharmacist, or health care provider.  2020 Elsevier/Gold Standard (2017-01-26 19:25:04)

## 2020-07-16 NOTE — Progress Notes (Signed)
START ON PATHWAY REGIMEN - Small Cell Lung     Cycles 1 through 4: A cycle is every 21 days:     Durvalumab      Carboplatin      Etoposide    Cycles 5 and beyond: A cycle is every 28 days:     Durvalumab   **Always confirm dose/schedule in your pharmacy ordering system**  Patient Characteristics: Newly Diagnosed, Preoperative or Nonsurgical Candidate (Clinical Staging), First Line, Extensive Stage Therapeutic Status: Newly Diagnosed, Preoperative or Nonsurgical Candidate (Clinical Staging) AJCC T Category: cT3 AJCC N Category: cN2 AJCC M Category: cM1c AJCC 8 Stage Grouping: IVB Stage Classification: Extensive  Intent of Therapy: Non-Curative / Palliative Intent, Discussed with Patient

## 2020-07-17 ENCOUNTER — Inpatient Hospital Stay: Payer: Medicare Other

## 2020-07-17 ENCOUNTER — Encounter: Payer: Self-pay | Admitting: *Deleted

## 2020-07-17 ENCOUNTER — Other Ambulatory Visit: Payer: Self-pay

## 2020-07-17 ENCOUNTER — Telehealth: Payer: Self-pay | Admitting: *Deleted

## 2020-07-17 NOTE — Telephone Encounter (Signed)
I followed up with auth team to see about [name] scan and if they are auth.  I was updated they are authed.  I called central scheduling and was given an appt. I called the patient and spoke with his wife.  I gave her the appt and pre-procedure instructions.  She verbalized understanding of appt and instructions.

## 2020-07-17 NOTE — Progress Notes (Signed)
I contacted [facility] auth coordinator to see if Guy Hester needs insurance auth for his MRI brain and PET.

## 2020-07-18 NOTE — Progress Notes (Signed)
Baseline AST/ALT > 3xULN currently; per Dr. Julien Nordmann, ok to decr Etoposide dose by 50%.  Will reassess Tbili w/ CMET on 8/24 and decide on using Cosela.  Kennith Center, Pharm.D., CPP 07/18/2020@11 :01 PM

## 2020-07-18 NOTE — Progress Notes (Signed)
Pharmacist Chemotherapy Monitoring - Initial Assessment    Anticipated start date: 07-30-20  Regimen:  . Are orders appropriate based on the patient's diagnosis, regimen, and cycle? Yes . Does the plan date match the patient's scheduled date? Yes . Is the sequencing of drugs appropriate? Yes . Are the premedications appropriate for the patient's regimen? Yes . Prior Authorization for treatment is: Approved o If applicable, is the correct biosimilar selected based on the patient's insurance? not applicable  Organ Function and Labs: Marland Kitchen Are dose adjustments needed based on the patient's renal function, hepatic function, or hematologic function? Yes - watch Tbili; if >/= 1.5, should consider holding Cosela.  AST/ALT > 3xULN currently; consider decr Etposide dose by 50%.  Message sent to Dr. Julien Nordmann. . Are appropriate labs ordered prior to the start of patient's treatment? Yes . Other organ system assessment, if indicated: N/A . The following baseline labs, if indicated, have been ordered: durvalumab: baseline TSH +/- T4  Dose Assessment: . Are the drug doses appropriate? Yes . Are the following correct: o Drug concentrations Yes o IV fluid compatible with drug Yes o Administration routes Yes o Timing of therapy Yes . If applicable, does the patient have documented access for treatment and/or plans for port-a-cath placement? no . If applicable, have lifetime cumulative doses been properly documented and assessed? not applicable Lifetime Dose Tracking  No doses have been documented on this patient for the following tracked chemicals: Doxorubicin, Epirubicin, Idarubicin, Daunorubicin, Mitoxantrone, Bleomycin, Oxaliplatin, Carboplatin, Liposomal Doxorubicin  o   Toxicity Monitoring/Prevention: . The patient has the following take home antiemetics prescribed: Prochlorperazine . The patient has the following take home medications prescribed: N/A . Medication allergies and previous infusion  related reactions, if applicable, have been reviewed and addressed. Yes . The patient's current medication list has been assessed for drug-drug interactions with their chemotherapy regimen. no significant drug-drug interactions were identified on review.  Order Review: . Are the treatment plan orders signed? No . Is the patient scheduled to see a provider prior to their treatment? No  I verify that I have reviewed each item in the above checklist and answered each question accordingly.   Kennith Center, Pharm.D., CPP 07/18/2020@4 :41 PM

## 2020-07-22 ENCOUNTER — Encounter: Payer: Self-pay | Admitting: Emergency Medicine

## 2020-07-22 ENCOUNTER — Inpatient Hospital Stay
Admission: EM | Admit: 2020-07-22 | Discharge: 2020-07-23 | DRG: 871 | Disposition: A | Discharge status: Hospice | Payer: Medicare Other | Attending: Family Medicine | Admitting: Family Medicine

## 2020-07-22 ENCOUNTER — Emergency Department: Payer: Medicare Other

## 2020-07-22 ENCOUNTER — Inpatient Hospital Stay: Payer: Medicare Other

## 2020-07-22 ENCOUNTER — Encounter: Payer: Self-pay | Admitting: *Deleted

## 2020-07-22 ENCOUNTER — Other Ambulatory Visit: Payer: Self-pay

## 2020-07-22 DIAGNOSIS — R6520 Severe sepsis without septic shock: Secondary | ICD-10-CM | POA: Diagnosis not present

## 2020-07-22 DIAGNOSIS — J189 Pneumonia, unspecified organism: Secondary | ICD-10-CM | POA: Diagnosis present

## 2020-07-22 DIAGNOSIS — Z20822 Contact with and (suspected) exposure to covid-19: Secondary | ICD-10-CM | POA: Diagnosis present

## 2020-07-22 DIAGNOSIS — J9601 Acute respiratory failure with hypoxia: Secondary | ICD-10-CM | POA: Diagnosis present

## 2020-07-22 DIAGNOSIS — R7401 Elevation of levels of liver transaminase levels: Secondary | ICD-10-CM | POA: Diagnosis present

## 2020-07-22 DIAGNOSIS — C7971 Secondary malignant neoplasm of right adrenal gland: Secondary | ICD-10-CM | POA: Diagnosis present

## 2020-07-22 DIAGNOSIS — R778 Other specified abnormalities of plasma proteins: Secondary | ICD-10-CM | POA: Diagnosis present

## 2020-07-22 DIAGNOSIS — R5381 Other malaise: Secondary | ICD-10-CM | POA: Diagnosis not present

## 2020-07-22 DIAGNOSIS — Z7401 Bed confinement status: Secondary | ICD-10-CM | POA: Diagnosis not present

## 2020-07-22 DIAGNOSIS — K5730 Diverticulosis of large intestine without perforation or abscess without bleeding: Secondary | ICD-10-CM | POA: Diagnosis present

## 2020-07-22 DIAGNOSIS — C3401 Malignant neoplasm of right main bronchus: Secondary | ICD-10-CM | POA: Diagnosis present

## 2020-07-22 DIAGNOSIS — E119 Type 2 diabetes mellitus without complications: Secondary | ICD-10-CM | POA: Diagnosis present

## 2020-07-22 DIAGNOSIS — I248 Other forms of acute ischemic heart disease: Secondary | ICD-10-CM | POA: Diagnosis present

## 2020-07-22 DIAGNOSIS — C787 Secondary malignant neoplasm of liver and intrahepatic bile duct: Secondary | ICD-10-CM | POA: Diagnosis present

## 2020-07-22 DIAGNOSIS — Z7189 Other specified counseling: Secondary | ICD-10-CM | POA: Diagnosis not present

## 2020-07-22 DIAGNOSIS — E872 Acidosis: Secondary | ICD-10-CM | POA: Diagnosis present

## 2020-07-22 DIAGNOSIS — J440 Chronic obstructive pulmonary disease with acute lower respiratory infection: Secondary | ICD-10-CM | POA: Diagnosis present

## 2020-07-22 DIAGNOSIS — J449 Chronic obstructive pulmonary disease, unspecified: Secondary | ICD-10-CM | POA: Diagnosis present

## 2020-07-22 DIAGNOSIS — R1084 Generalized abdominal pain: Secondary | ICD-10-CM | POA: Diagnosis not present

## 2020-07-22 DIAGNOSIS — Z823 Family history of stroke: Secondary | ICD-10-CM

## 2020-07-22 DIAGNOSIS — J181 Lobar pneumonia, unspecified organism: Secondary | ICD-10-CM | POA: Diagnosis not present

## 2020-07-22 DIAGNOSIS — E222 Syndrome of inappropriate secretion of antidiuretic hormone: Secondary | ICD-10-CM | POA: Diagnosis present

## 2020-07-22 DIAGNOSIS — H9190 Unspecified hearing loss, unspecified ear: Secondary | ICD-10-CM | POA: Diagnosis present

## 2020-07-22 DIAGNOSIS — C772 Secondary and unspecified malignant neoplasm of intra-abdominal lymph nodes: Secondary | ICD-10-CM | POA: Diagnosis present

## 2020-07-22 DIAGNOSIS — C786 Secondary malignant neoplasm of retroperitoneum and peritoneum: Secondary | ICD-10-CM | POA: Diagnosis present

## 2020-07-22 DIAGNOSIS — I2510 Atherosclerotic heart disease of native coronary artery without angina pectoris: Secondary | ICD-10-CM | POA: Diagnosis present

## 2020-07-22 DIAGNOSIS — M2550 Pain in unspecified joint: Secondary | ICD-10-CM | POA: Diagnosis not present

## 2020-07-22 DIAGNOSIS — Z515 Encounter for palliative care: Secondary | ICD-10-CM | POA: Diagnosis not present

## 2020-07-22 DIAGNOSIS — R0689 Other abnormalities of breathing: Secondary | ICD-10-CM | POA: Diagnosis not present

## 2020-07-22 DIAGNOSIS — E871 Hypo-osmolality and hyponatremia: Secondary | ICD-10-CM

## 2020-07-22 DIAGNOSIS — C7972 Secondary malignant neoplasm of left adrenal gland: Secondary | ICD-10-CM | POA: Diagnosis present

## 2020-07-22 DIAGNOSIS — Z66 Do not resuscitate: Secondary | ICD-10-CM | POA: Diagnosis present

## 2020-07-22 DIAGNOSIS — Z79899 Other long term (current) drug therapy: Secondary | ICD-10-CM

## 2020-07-22 DIAGNOSIS — Z833 Family history of diabetes mellitus: Secondary | ICD-10-CM

## 2020-07-22 DIAGNOSIS — R000 Tachycardia, unspecified: Secondary | ICD-10-CM | POA: Diagnosis not present

## 2020-07-22 DIAGNOSIS — C3491 Malignant neoplasm of unspecified part of right bronchus or lung: Secondary | ICD-10-CM | POA: Diagnosis present

## 2020-07-22 DIAGNOSIS — I959 Hypotension, unspecified: Secondary | ICD-10-CM | POA: Diagnosis not present

## 2020-07-22 DIAGNOSIS — F17210 Nicotine dependence, cigarettes, uncomplicated: Secondary | ICD-10-CM | POA: Diagnosis present

## 2020-07-22 DIAGNOSIS — F17200 Nicotine dependence, unspecified, uncomplicated: Secondary | ICD-10-CM | POA: Diagnosis present

## 2020-07-22 DIAGNOSIS — R9431 Abnormal electrocardiogram [ECG] [EKG]: Secondary | ICD-10-CM | POA: Diagnosis not present

## 2020-07-22 DIAGNOSIS — R531 Weakness: Secondary | ICD-10-CM | POA: Diagnosis not present

## 2020-07-22 DIAGNOSIS — A419 Sepsis, unspecified organism: Principal | ICD-10-CM

## 2020-07-22 DIAGNOSIS — J9811 Atelectasis: Secondary | ICD-10-CM | POA: Diagnosis not present

## 2020-07-22 DIAGNOSIS — C7970 Secondary malignant neoplasm of unspecified adrenal gland: Secondary | ICD-10-CM | POA: Diagnosis not present

## 2020-07-22 DIAGNOSIS — N179 Acute kidney failure, unspecified: Secondary | ICD-10-CM | POA: Diagnosis present

## 2020-07-22 DIAGNOSIS — R001 Bradycardia, unspecified: Secondary | ICD-10-CM | POA: Diagnosis not present

## 2020-07-22 DIAGNOSIS — R17 Unspecified jaundice: Secondary | ICD-10-CM | POA: Diagnosis present

## 2020-07-22 DIAGNOSIS — C3490 Malignant neoplasm of unspecified part of unspecified bronchus or lung: Secondary | ICD-10-CM | POA: Diagnosis not present

## 2020-07-22 DIAGNOSIS — E861 Hypovolemia: Secondary | ICD-10-CM | POA: Diagnosis present

## 2020-07-22 DIAGNOSIS — R6521 Severe sepsis with septic shock: Secondary | ICD-10-CM | POA: Diagnosis present

## 2020-07-22 DIAGNOSIS — R54 Age-related physical debility: Secondary | ICD-10-CM | POA: Diagnosis present

## 2020-07-22 DIAGNOSIS — R6889 Other general symptoms and signs: Secondary | ICD-10-CM | POA: Diagnosis present

## 2020-07-22 DIAGNOSIS — J90 Pleural effusion, not elsewhere classified: Secondary | ICD-10-CM

## 2020-07-22 DIAGNOSIS — F064 Anxiety disorder due to known physiological condition: Secondary | ICD-10-CM | POA: Diagnosis present

## 2020-07-22 LAB — COMPREHENSIVE METABOLIC PANEL
ALT: 195 U/L — ABNORMAL HIGH (ref 0–44)
ALT: 201 U/L — ABNORMAL HIGH (ref 0–44)
AST: 296 U/L — ABNORMAL HIGH (ref 15–41)
AST: 354 U/L — ABNORMAL HIGH (ref 15–41)
Albumin: 2.3 g/dL — ABNORMAL LOW (ref 3.5–5.0)
Albumin: 1.8 g/dL — ABNORMAL LOW (ref 3.5–5.0)
Alkaline Phosphatase: 210 U/L — ABNORMAL HIGH (ref 38–126)
Alkaline Phosphatase: 172 U/L — ABNORMAL HIGH (ref 38–126)
Anion gap: 20 — ABNORMAL HIGH (ref 5–15)
Anion gap: 13 (ref 5–15)
BUN: 92 mg/dL — ABNORMAL HIGH (ref 8–23)
BUN: 79 mg/dL — ABNORMAL HIGH (ref 8–23)
CO2: 18 mmol/L — ABNORMAL LOW (ref 22–32)
CO2: 19 mmol/L — ABNORMAL LOW (ref 22–32)
Calcium: 7.4 mg/dL — ABNORMAL LOW (ref 8.9–10.3)
Calcium: 6.8 mg/dL — ABNORMAL LOW (ref 8.9–10.3)
Chloride: 91 mmol/L — ABNORMAL LOW (ref 98–111)
Chloride: 100 mmol/L (ref 98–111)
Creatinine, Ser: 2.89 mg/dL — ABNORMAL HIGH (ref 0.61–1.24)
Creatinine, Ser: 2.12 mg/dL — ABNORMAL HIGH (ref 0.61–1.24)
GFR calc Af Amer: 25 mL/min — ABNORMAL LOW (ref >60)
GFR calc Af Amer: 36 mL/min — ABNORMAL LOW (ref >60)
GFR calc non Af Amer: 21 mL/min — ABNORMAL LOW (ref >60)
GFR calc non Af Amer: 31 mL/min — ABNORMAL LOW (ref >60)
Glucose, Bld: 253 mg/dL — ABNORMAL HIGH (ref 70–99)
Glucose, Bld: 218 mg/dL — ABNORMAL HIGH (ref 70–99)
Potassium: 5.1 mmol/L (ref 3.5–5.1)
Potassium: 4.5 mmol/L (ref 3.5–5.1)
Sodium: 129 mmol/L — ABNORMAL LOW (ref 135–145)
Sodium: 132 mmol/L — ABNORMAL LOW (ref 135–145)
Total Bilirubin: 2 mg/dL — ABNORMAL HIGH (ref 0.3–1.2)
Total Bilirubin: 2.1 mg/dL — ABNORMAL HIGH (ref 0.3–1.2)
Total Protein: 5.7 g/dL — ABNORMAL LOW (ref 6.5–8.1)
Total Protein: 4.5 g/dL — ABNORMAL LOW (ref 6.5–8.1)

## 2020-07-22 LAB — CBC WITH DIFFERENTIAL/PLATELET
Abs Immature Granulocytes: 0.95 10*3/uL — ABNORMAL HIGH (ref 0.00–0.07)
Basophils Absolute: 0.1 10*3/uL (ref 0.0–0.1)
Basophils Relative: 0 %
Eosinophils Absolute: 0.1 10*3/uL (ref 0.0–0.5)
Eosinophils Relative: 1 %
HCT: 37.8 % — ABNORMAL LOW (ref 39.0–52.0)
Hemoglobin: 12.7 g/dL — ABNORMAL LOW (ref 13.0–17.0)
Immature Granulocytes: 4 %
Lymphocytes Relative: 6 %
Lymphs Abs: 1.4 10*3/uL (ref 0.7–4.0)
MCH: 30.1 pg (ref 26.0–34.0)
MCHC: 33.6 g/dL (ref 30.0–36.0)
MCV: 89.6 fL (ref 80.0–100.0)
Monocytes Absolute: 1.1 10*3/uL — ABNORMAL HIGH (ref 0.1–1.0)
Monocytes Relative: 5 %
Neutro Abs: 19.6 10*3/uL — ABNORMAL HIGH (ref 1.7–7.7)
Neutrophils Relative %: 84 %
Platelets: 448 10*3/uL — ABNORMAL HIGH (ref 150–400)
RBC: 4.22 MIL/uL (ref 4.22–5.81)
RDW: 13.1 % (ref 11.5–15.5)
WBC: 23.3 10*3/uL — ABNORMAL HIGH (ref 4.0–10.5)
nRBC: 0.2 % (ref 0.0–0.2)

## 2020-07-22 LAB — SARS CORONAVIRUS 2 BY RT PCR (HOSPITAL ORDER, PERFORMED IN ~~LOC~~ HOSPITAL LAB): SARS Coronavirus 2: NEGATIVE

## 2020-07-22 LAB — BODY FLUID CELL COUNT WITH DIFFERENTIAL
Eos, Fluid: 0 %
Lymphs, Fluid: 16 %
Monocyte-Macrophage-Serous Fluid: 6 %
Neutrophil Count, Fluid: 78 %
Total Nucleated Cell Count, Fluid: 1282 cu mm

## 2020-07-22 LAB — APTT: aPTT: 28 seconds (ref 24–36)

## 2020-07-22 LAB — ALBUMIN, PLEURAL OR PERITONEAL FLUID: Albumin, Fluid: 1.2 g/dL

## 2020-07-22 LAB — GLUCOSE, PLEURAL OR PERITONEAL FLUID: Glucose, Fluid: 234 mg/dL

## 2020-07-22 LAB — GLUCOSE, CAPILLARY: Glucose-Capillary: 220 mg/dL — ABNORMAL HIGH (ref 70–99)

## 2020-07-22 LAB — TROPONIN I (HIGH SENSITIVITY)
Troponin I (High Sensitivity): 41 ng/L — ABNORMAL HIGH (ref <18)
Troponin I (High Sensitivity): 26 ng/L — ABNORMAL HIGH (ref <18)

## 2020-07-22 LAB — LACTATE DEHYDROGENASE, PLEURAL OR PERITONEAL FLUID: LD, Fluid: 432 U/L — ABNORMAL HIGH (ref 3–23)

## 2020-07-22 LAB — LACTIC ACID, PLASMA
Lactic Acid, Venous: 5.5 mmol/L (ref 0.5–1.9)
Lactic Acid, Venous: 5.8 mmol/L (ref 0.5–1.9)

## 2020-07-22 LAB — PROTIME-INR
INR: 1.2 (ref 0.8–1.2)
Prothrombin Time: 15.1 seconds (ref 11.4–15.2)

## 2020-07-22 LAB — PROTEIN, PLEURAL OR PERITONEAL FLUID: Total protein, fluid: <3 g/dL

## 2020-07-22 MED ORDER — POLYETHYLENE GLYCOL 3350 17 G PO PACK: 17.0000 g | PACK | Freq: Every day | ORAL | Status: DC | PRN

## 2020-07-22 MED ORDER — ALBUTEROL SULFATE (2.5 MG/3ML) 0.083% IN NEBU
2.5000 mg | INHALATION_SOLUTION | Freq: Four times a day (QID) | RESPIRATORY_TRACT | Status: DC
  Administered 2020-07-22 – 2020-07-23 (×5): 2.5 mg via RESPIRATORY_TRACT
  Filled 2020-07-22 (×5): qty 3

## 2020-07-22 MED ORDER — LACTATED RINGERS IV BOLUS
1000.0000 mL | Freq: Once | INTRAVENOUS | Status: AC
  Administered 2020-07-22: 1000 mL via INTRAVENOUS

## 2020-07-22 MED ORDER — LACTATED RINGERS IV BOLUS (SEPSIS)
1000.0000 mL | Freq: Once | INTRAVENOUS | Status: AC
  Administered 2020-07-22: 1000 mL via INTRAVENOUS

## 2020-07-22 MED ORDER — LACTATED RINGERS IV SOLN: INTRAVENOUS | Status: DC

## 2020-07-22 MED ORDER — IPRATROPIUM-ALBUTEROL 0.5-2.5 (3) MG/3ML IN SOLN
3.0000 mL | Freq: Once | RESPIRATORY_TRACT | Status: AC
  Administered 2020-07-22: 3 mL via RESPIRATORY_TRACT
  Filled 2020-07-22: qty 3

## 2020-07-22 MED ORDER — OXYCODONE HCL 5 MG PO TABS: 5.0000 mg | ORAL_TABLET | ORAL | Status: DC | PRN

## 2020-07-22 MED ORDER — ACETAMINOPHEN 325 MG PO TABS: 650.0000 mg | ORAL_TABLET | Freq: Four times a day (QID) | ORAL | Status: DC | PRN

## 2020-07-22 MED ORDER — ACETAMINOPHEN 650 MG RE SUPP: 650.0000 mg | Freq: Four times a day (QID) | RECTAL | Status: DC | PRN

## 2020-07-22 MED ORDER — TRAZODONE HCL 50 MG PO TABS: 50.0000 mg | ORAL_TABLET | Freq: Every evening | ORAL | Status: DC | PRN

## 2020-07-22 MED ORDER — VANCOMYCIN HCL IN DEXTROSE 1-5 GM/200ML-% IV SOLN
1000.0000 mg | Freq: Once | INTRAVENOUS | Status: AC
  Administered 2020-07-22: 1000 mg via INTRAVENOUS
  Filled 2020-07-22: qty 200

## 2020-07-22 MED ORDER — METRONIDAZOLE IN NACL 5-0.79 MG/ML-% IV SOLN
500.0000 mg | Freq: Once | INTRAVENOUS | Status: AC
  Administered 2020-07-22: 500 mg via INTRAVENOUS
  Filled 2020-07-22: qty 100

## 2020-07-22 MED ORDER — ONDANSETRON HCL 4 MG/2ML IJ SOLN: 4.0000 mg | Freq: Four times a day (QID) | INTRAMUSCULAR | Status: DC | PRN

## 2020-07-22 MED ORDER — NICOTINE 14 MG/24HR TD PT24
14.0000 mg | MEDICATED_PATCH | Freq: Every day | TRANSDERMAL | Status: DC
  Filled 2020-07-22: qty 1

## 2020-07-22 MED ORDER — POLYETHYLENE GLYCOL 3350 17 G PO PACK: 17.0000 g | PACK | Freq: Two times a day (BID) | ORAL | Status: DC

## 2020-07-22 MED ORDER — CALCIUM GLUCONATE-NACL 1-0.675 GM/50ML-% IV SOLN
1.0000 g | Freq: Once | INTRAVENOUS | Status: AC
  Administered 2020-07-22: 1000 mg via INTRAVENOUS
  Filled 2020-07-22: qty 50

## 2020-07-22 MED ORDER — SODIUM CHLORIDE 0.9 % IV SOLN: Freq: Once | INTRAVENOUS | Status: AC

## 2020-07-22 MED ORDER — MINERAL OIL RE ENEM: 1.0000 | ENEMA | Freq: Once | RECTAL | Status: DC

## 2020-07-22 MED ORDER — HYDROMORPHONE HCL 1 MG/ML IJ SOLN: 0.5000 mg | INTRAMUSCULAR | Status: DC | PRN

## 2020-07-22 MED ORDER — SODIUM CHLORIDE 0.9 % IV SOLN
2.0000 g | Freq: Once | INTRAVENOUS | Status: AC
  Administered 2020-07-22: 2 g via INTRAVENOUS
  Filled 2020-07-22: qty 2

## 2020-07-22 MED ORDER — ONDANSETRON HCL 4 MG PO TABS: 4.0000 mg | ORAL_TABLET | Freq: Four times a day (QID) | ORAL | Status: DC | PRN

## 2020-07-22 MED ORDER — INSULIN ASPART 100 UNIT/ML ~~LOC~~ SOLN
0.0000 [IU] | Freq: Three times a day (TID) | SUBCUTANEOUS | Status: DC
  Administered 2020-07-22: 3 [IU] via SUBCUTANEOUS
  Administered 2020-07-23: 2 [IU] via SUBCUTANEOUS
  Filled 2020-07-22 (×2): qty 1

## 2020-07-22 MED ORDER — METRONIDAZOLE IN NACL 5-0.79 MG/ML-% IV SOLN
500.0000 mg | Freq: Three times a day (TID) | INTRAVENOUS | Status: DC
  Administered 2020-07-23 (×2): 500 mg via INTRAVENOUS
  Filled 2020-07-22 (×2): qty 100

## 2020-07-22 MED ORDER — SODIUM CHLORIDE 0.9 % IV BOLUS
500.0000 mL | Freq: Once | INTRAVENOUS | Status: AC
  Administered 2020-07-22: 500 mL via INTRAVENOUS

## 2020-07-22 MED ORDER — SODIUM CHLORIDE 0.9% FLUSH
3.0000 mL | Freq: Two times a day (BID) | INTRAVENOUS | Status: DC
  Administered 2020-07-22: 3 mL via INTRAVENOUS

## 2020-07-22 MED ORDER — SODIUM CHLORIDE 0.9 % IV SOLN
2.0000 g | INTRAVENOUS | Status: DC
  Administered 2020-07-23: 2 g via INTRAVENOUS
  Filled 2020-07-22: qty 2

## 2020-07-22 MED ORDER — IPRATROPIUM BROMIDE 0.02 % IN SOLN
0.5000 mg | Freq: Four times a day (QID) | RESPIRATORY_TRACT | Status: DC
  Administered 2020-07-22 – 2020-07-23 (×4): 0.5 mg via RESPIRATORY_TRACT
  Filled 2020-07-22 (×4): qty 2.5

## 2020-07-22 NOTE — ED Notes (Signed)
Pt assisted back onto bedpan again as requested. Had only smear of BM earlier and had been provided peri care and repositioned.

## 2020-07-22 NOTE — H&P (Addendum)
[facility] History and Physical  Guy Hester VFI:[id number] DOB: 05-20-1951 DOA: 07/22/2020  Referring physician: Dr. Quentin Cornwall PCP: Ria Bush, MD   Chief Complaint: weakness  HPI: Guy Hester is a 69 y.o. 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  Review of chart shows patient recently diagnosed with stage IV small cell lung cancer last month.  Per last oncology note on August 17 he presented to PCP worsening dyspnea, which led to work-up which showed diffuse metastatic disease likely from primary lung cancer.  He had CT-guided biopsy of lung mass on August 4 pathology showing small cell lung cancer.  At this last oncology visit decision was made to proceed with palliative chemotherapy which was scheduled to begin today.  Patient and his wife interviewed at bedside in the ED.  Patient with difficulty providing history due to significant tachypnea.  Able to state that he has been feeling more short of breath as well as having significant abdominal pain.  Wife reports that his shortness of breath has gotten progressively worse over the past few days, he has also had abdominal pain and very few bowel movements though he did have one yesterday.  Discussed CODE STATUS in depth with patient and his wife and they confirmed that they would like to be DNR.  Discussed plan to start palliative chemotherapy, at which point wife stated that they had canceled their visit for chemotherapy and they no longer wish to pursue this.  In the ED initial vital signs notable for fever to 101.3 Fahrenheit, tachypnea in the 30s which subsequently improved to the 20s, as well as hypoxia to 85% on room air which improved to 95% on 4 L of oxygen.  Lab work-up was notable for BMP showing sodium 129, creatinine 2.89, AST 296, ALT 195, T  bili 2.0, 12 CBC showing white count of 23.3, troponin 41, lactic acid 5.5, normal coags.  He underwent a chest x-ray which showed a large right pleural effusion, subsequently had a CT chest as well as abdomen pelvis which showed reduction of the stage IV cancer since last scan 1 month prior as well as moderate to large right pleural effusion.  Started on sepsis protocol with fluids, as well as IV antibiotics (cefepime, Flagyl, vancomycin).  He was admitted to stepdown for further management of his sepsis acute hypoxemic respiratory failure.  Review of Systems:  Pertinent positives and negative per HPI, all others reviewed and negative   Past Medical History:  Diagnosis Date  . COPD (chronic obstructive pulmonary disease) ([place])   . Dyspnea   . Heavy alcohol use   . History of asthma    childhood  . History of chicken pox   . Smoker    Past Surgical History:  Procedure Laterality Date  . INGUINAL HERNIA REPAIR Right 2010  . VARICOSE VEIN SURGERY Right    laser   Social History:  reports that he quit smoking about 4 weeks ago. His smoking use included cigarettes. He started smoking about 56 years ago. He has a 50.00 pack-year smoking history. He has never used smokeless tobacco. He reports previous alcohol use. He reports that he does not use drugs.  No Known Allergies  Family History  Problem Relation Age of Onset  . Diabetes Mother   . Stroke Mother 44       x3  .  Aneurysm Father        thoracic aortic aneurysm rupture  . Diabetes Brother   . Diabetes Brother   . Cancer Neg Hx   . CAD Neg Hx     Prior to Admission medications   Medication Sig Start Date End Date Taking? Authorizing Provider  albuterol (VENTOLIN HFA) 108 (90 Base) MCG/ACT inhaler Inhale 2 puffs into the lungs every 6 (six) hours as needed for wheezing or shortness of breath. 06/25/20   Ria Bush, MD  nicotine (NICODERM CQ) 14 mg/24hr patch Place 1 patch (14 mg total) onto the skin daily. 06/25/20    Ria Bush, MD  oxyCODONE (ROXICODONE) 5 MG immediate release tablet Take 1 tablet (5 mg total) by mouth every 6 (six) hours as needed for severe pain. 07/16/20   Heilingoetter, Cassandra L, PA-C  prochlorperazine (COMPAZINE) 10 MG tablet Take 1 tablet (10 mg total) by mouth every 6 (six) hours as needed. 07/16/20   Heilingoetter, Cassandra L, PA-C  tiotropium (SPIRIVA HANDIHALER) 18 MCG inhalation capsule Place 1 capsule (18 mcg total) into inhaler and inhale daily. 06/25/20   Ria Bush, MD   Physical Exam: Vitals:   07/22/20 0843 07/22/20 0845 07/22/20 1010 07/22/20 1045  BP:  103/83 119/62 91/64  Pulse:  (!) 117    Resp:  (!) 35  (!) 32  Temp:  (!) 101.3 F (38.5 C)    TempSrc:  Rectal    SpO2:  (!) 85%    Weight: 82.7 kg     Height: 5' 3.5" (1.613 m)       Wt Readings from Last 3 Encounters:  07/22/20 82.7 kg  07/16/20 86.6 kg  07/03/20 92.1 kg    General:  Ill appearing Eyes: PERRL, normal lids, irises & conjunctiva ENT: hard of hearing Neck: no masses Cardiovascular: regular, tachycardic, no m/r/g. No LE edema. Telemetry: SR, no arrhythmias  Respiratory: Increased work of breathing, speaking in 2-3 words at a time. Good air flow in left lung with expiratory wheezes, decreased breath sounds throughout R lung field. Abdomen: soft, hypoactive bowel sounds, diffuse mild tenderness to palpation without rebound or guarding Skin: no rash or induration seen on limited exam Musculoskeletal: grossly normal tone BUE/BLE Psychiatric: grossly normal mood and affect, speech fluent and appropriate Neurologic: grossly non-focal.          Labs on Admission:  Basic Metabolic Panel: Recent Labs  Lab 07/16/20 1250 07/22/20 0835  NA 133* 129*  K 4.6 5.1  CL 100 91*  CO2 19* 18*  GLUCOSE 231* 253*  BUN 19 92*  CREATININE 1.07 2.89*  CALCIUM 8.8* 7.4*   Liver Function Tests: Recent Labs  Lab 07/16/20 1250 07/22/20 0835  AST 134* 296*  ALT 130* 195*  ALKPHOS  192* 210*  BILITOT 1.1 2.0*  PROT 5.9* 5.7*  ALBUMIN 2.1* 2.3*   No results for input(s): LIPASE, AMYLASE in the last 168 hours. No results for input(s): AMMONIA in the last 168 hours. CBC: Recent Labs  Lab 07/16/20 1250 07/22/20 0835  WBC 20.0* 23.3*  NEUTROABS 17.1* 19.6*  HGB 14.3 12.7*  HCT 42.7 37.8*  MCV 89.5 89.6  PLT 418* 448*   Cardiac Enzymes: No results for input(s): CKTOTAL, CKMB, CKMBINDEX, TROPONINI in the last 168 hours.  BNP (last 3 results) No results for input(s): BNP in the last 8760 hours.  ProBNP (last 3 results) No results for input(s): PROBNP in the last 8760 hours.  CBG: No results for input(s): GLUCAP in the last  168 hours.  Radiological Exams on Admission: DG Chest Port 1 View  Result Date: 07/22/2020 CLINICAL DATA:  Shortness of breath EXAM: PORTABLE CHEST 1 VIEW COMPARISON:  June 25, 2020 FINDINGS: There is a moderate pleural effusion on the right with underlying atelectasis and consolidation throughout the right mid lower lung zones and to a lesser extent in the right upper lobe. Left lung is clear. Heart size is normal. Pulmonary vascularity on the left peers normal. Pulmonary vascularity on the right is partially obscured by effusion and infiltrate. No adenopathy is appreciable with limitations on the right due to overlying fluid and infiltrate. No bone lesions evident. IMPRESSION: Moderate pleural effusion on the right with underlying atelectasis and presumed consolidation right mid and lower lung regions and to a lesser degree in the right upper lobe. Left lung clear. Cardiac silhouette appears stable compared to recent study. Electronically Signed   By: Lowella Grip III M.D.   On: 07/22/2020 08:45    EKG: Independently reviewed.  Sinus rhythm, no ischemic changes.  No priors for comparison.  Assessment/Plan Active Problems:   * No active hospital problems. *  Castle Lamons is a 69 y.o. 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   #Acute hypoxemic respiratory failure #Sepsis #Lactic acidosis #R pnuemonia #R pleural effusion Patient presenting with significant tachypnea and hypoxemia currently requiring 5 L of oxygen, also febrile and meeting criteria for sepsis.  Large right pleural effusion is contributing significantly to his impaired respiratory status, IR have been consulted for ultrasound-guided drainage.  Likely also element of underlying pneumonia from obstruction, will cover broadly with antibiotics.  Initial lactate has remained flat from 5.5-5.8, will continue aggressive fluid resuscitation. -Continue cefepime, Flagyl, vancomycin  -Secure chat w Dr. Annamaria Boots from IR for drainage of large right pleural effusion, US Thoracentesis ordered they will try to get done today -Follow-up pleural fluid studies -Follow-up blood cultures -Supplemental oxygen as needed -Trend lactate   #Abdominal pain Patient wife reporting abdominal pain of unclear duration as well as significant constipation.  Review of radiology report of CT abdomen and pelvis shows diffuse metastases in the liver, omentum, adrenal glands, and mesenteric lymphadenopathy.  There is also note of some retained stool in the large bowel, and sigmoid diverticulosis.  Both of these etiologies likely contributing to his discomfort, will treat symptomatically.  Some consideration should be given to bowel ischemia given his ongoing lactic acidosis, however he does not have any history of A. fib or other etiology for embolic phenomena and I think this is less likely.  If lactic acidosis does not respond to fluids and abdominal pain does not respond to symptomatic  treatment however I would pursue this further. -Patient amenable to enema -MiraLAX twice daily -Trend lactate  #Small cell lung cancer Patient and his wife report they no longer wish to pursue palliative treatment for his lung cancer.  Discussion of CODE STATUS they have decided to be DNR.  Patient is a good candidate for hospice. -Consult palliative care  #Hyponatremia Sodium 129 admission down from 133 about a week ago, suspect secondary to hypovolemia in the setting of sepsis.  SIADH secondary to his small cell lung cancer is another possibility.  Will fluid resuscitate  and reassess. -Trend CMP  #DM2 Last A1c 6.9% on July 27, will manage with sliding scale insulin for the time being. -Aspart insulin sliding scale -Reassess need for basal insulin  #AKI Ackley prerenal in the setting of sepsis, receiving 2 L crystalloid bolus, will reassess. -Trend CMP  #Hypocalcemia Calcium 7.4 on admission down from 8.8 last week, will replete IV and recheck CMP later this afternoon.  #Transaminitis #Hyperbilirubinemia LFTs and bili normal last month, elevation likely secondary to hypovolemia in the setting of sepsis in addition to his liver metastases seen on imaging.  Fluid resuscitate and trend CMP.  #Elevated troponin EKG nonischemic on admission, troponin initially elevated to 41 and downtrending to 26 on recheck 3 hours later.  Known CAD seen on CT chest last month.  Suspect demand ischemia in the setting of sepsis and underlying CAD, patient has not reported chest pain, defer further work-up at this time.   Code Status: DNR, confirmed DVT Prophylaxis: SCDs Family Communication: Wife Guy Hester updated at bedside Disposition Plan: Inpatient step-down unit  Time spent: 62 min  Clarnce Flock MD/[facility]

## 2020-07-22 NOTE — ED Notes (Signed)
Pt resting in bed. Visitor remains with pt. Bed locked low. Rails up. Call bell within reach.

## 2020-07-22 NOTE — ED Notes (Signed)
Pt laying calmly in bed. Currently refusing enema. Told to notify this RN if he changes his mind. Asked if he would like pain meds since his BP has been WDL last two checks. Pt did not answer this RN. Eyes open and chest rise and fall visible. Pt did not verbally answer or gesture with his hands or head. Will ask pt again once back from US thoracentesis.

## 2020-07-22 NOTE — ED Notes (Signed)
Monitor temporarily silenced for pt/family.

## 2020-07-22 NOTE — ED Notes (Signed)
Pt taken off bedpan. Constipated. Pt/wife requesting laxative.

## 2020-07-22 NOTE — ED Notes (Addendum)
Pt remains constipated. Provider notified as pt requesting something bedsides miralax as he states he had already tried this twice at home.

## 2020-07-22 NOTE — ED Notes (Signed)
Pt sleeping. Visitor remains at bedside.

## 2020-07-22 NOTE — ED Notes (Signed)
Pt given food tray and drink.

## 2020-07-22 NOTE — ED Notes (Signed)
Pt repositioned

## 2020-07-22 NOTE — ED Triage Notes (Signed)
Patient to ER from home via [facility] for declining health over last two weeks. Patient has multiple cancers, found he was unable to get up today. Also c/o decreased urine output since yesterday, urinated one time today with scant amount of dark brown urine. Patient is tachypneic, alert and oriented x4, +cool extremities. Patient's CBG was 186 with EMS, 120/80 and 84/64 BP's.

## 2020-07-22 NOTE — Procedures (Signed)
PROCEDURE SUMMARY:  Successful US guided right thoracentesis. Yielded 1.5 L of hazy amber fluid. Pt tolerated procedure well. No immediate complications.  Specimen was sent for labs. CXR ordered.  EBL < 5 mL  Ascencion Dike PA-C 07/22/2020 2:33 PM

## 2020-07-22 NOTE — Progress Notes (Signed)
I followed up on Mr. Guy Hester schedule.  His chemo plan needs 3 days in a row.  I updated scheduling to scheduling him for his 3rd does on 07/26/20. I did notice that Mr. Guy Hester is currently in the hospital and his schedule may change. Will re-check his schedule.

## 2020-07-22 NOTE — ED Notes (Signed)
Whiel being asssited onto bedpan, pt accidentally pulled out his hand IV. LR switched to ac IV.

## 2020-07-22 NOTE — ED Notes (Signed)
2nd bag of vanc hung for a total of 2000mg  dose per pharm verbal to this RN.

## 2020-07-22 NOTE — Progress Notes (Signed)
PHARMACY -  BRIEF ANTIBIOTIC NOTE   Pharmacy has received consult(s) for Vancomycin and Cefepime from an ED provider.  The patient's profile has been reviewed for ht/wt/allergies/indication/available labs.    One time order(s) placed for Vancomycin 1g and Cefepime 2g by ED provider  Further antibiotics/pharmacy consults should be ordered by admitting physician if indicated.                       Thank you, Vira Blanco 07/22/2020  9:20 AM

## 2020-07-22 NOTE — ED Notes (Addendum)
Pt declined offer of pain meds. Denies nausea.

## 2020-07-22 NOTE — Consult Note (Signed)
Pharmacy Antibiotic Note  Guy Hester is a 69 y.o. male admitted on 07/22/2020 with sepsis.  Pharmacy has been consulted for cefepime and vancomycin dosing.  Plan: Cefepime 2g q24H   Pt received vancomycin 1000 mg x 1 in the ED. Will give another vancomycin 1000 mg x 1 dose for a total of 2000 mg for the loading dose. Baseline Scr seems to be around 1 mg/dL. Today creatinine jumped to 2.89. Will not order a maintenance dose of vancomycin today. Will follow up with creatinine tomorrow and dose vancomycin as appropriate.   Height: 5' 3.5" (161.3 cm) Weight: 82.7 kg (182 lb 4.8 oz) IBW/kg (Calculated) : 58.05  Temp (24hrs), Avg:101.3 F (38.5 C), Min:101.3 F (38.5 C), Max:101.3 F (38.5 C)  Recent Labs  Lab 07/16/20 1250 07/22/20 0835  WBC 20.0* 23.3*  CREATININE 1.07 2.89*  LATICACIDVEN  --  5.5*    Estimated Creatinine Clearance: 23.5 mL/min (A) (by C-G formula based on SCr of 2.89 mg/dL (H)).    No Known Allergies  Antimicrobials this admission: 8/23 vancomycin >>  8/23 cefepime >>   Dose adjustments this admission: None  Microbiology results: 8/23 BCx: pending  Thank you for allowing pharmacy to be a part of this patient's care.  Oswald Hillock, PharmD, BCPS 07/22/2020 11:59 AM

## 2020-07-22 NOTE — Progress Notes (Signed)
     Referral received for Guy Hester :goals of care discussion.  I was able to speak with patient's wife, Guy Hester. I introduced myself and the role of Palliative in [name] care. She verbalized understanding.   [place] meeting scheduled for 07/23/20 @ 1030. Family is aware we will meet at patient's bedside.   Thank you for your referral and allowing PMT to assist in Guy Hester's care.   Alda Lea, AGPCNP-BC Palliative Medicine Team  Phone: [phone] Pager: [phone] Amion: N. [place]   NO CHARGE

## 2020-07-22 NOTE — ED Notes (Signed)
Pt resting in bed. Visitor remains with pt. Bed locked low. Rail up. Call bell within reach.

## 2020-07-22 NOTE — ED Notes (Signed)
Fixed monitor for pt.

## 2020-07-22 NOTE — ED Notes (Signed)
Messaged provider [place] via secure chat to determine if pt can have diet order or if needs to remain NPO as pt requesting food/drink.

## 2020-07-22 NOTE — ED Notes (Signed)
NT's at bedside assisting pt.

## 2020-07-22 NOTE — ED Provider Notes (Signed)
[facility] Emergency Department Provider Note    First MD Initiated Contact with Patient 07/22/20 [phone]     (approximate)  I have reviewed the triage vital signs and the nursing notes.   HISTORY  Chief Complaint Code sepsis   HPI Guy Hester is a 69 y.o. male below his past medical history presents to the ER for increasing weakness.  EMS called code sepsis in route.  Patient with placed on oxygen for comfort but on arrival to the ER actually is hypoxic satting 85% on room air.  Patient stating that he hurts all over.  He is febrile to 101.3.    Past Medical History:  Diagnosis Date  . COPD (chronic obstructive pulmonary disease) ([place])   . Dyspnea   . Heavy alcohol use   . History of asthma    childhood  . History of chicken pox   . Smoker    Family History  Problem Relation Age of Onset  . Diabetes Mother   . Stroke Mother 25       x3  . Aneurysm Father        thoracic aortic aneurysm rupture  . Diabetes Brother   . Diabetes Brother   . Cancer Neg Hx   . CAD Neg Hx    Past Surgical History:  Procedure Laterality Date  . INGUINAL HERNIA REPAIR Right 2010  . VARICOSE VEIN SURGERY Right    laser   Patient Active Problem List   Diagnosis Date Noted  . Primary lung small cell carcinoma, right ([place]) 07/16/2020  . Small cell lung cancer ([place]) 07/16/2020  . Mass of right lung 06/26/2020  . Medicare annual wellness visit, initial 06/25/2020  . Goals of care, counseling/discussion 06/25/2020  . COPD (chronic obstructive pulmonary disease) ([place]) 06/25/2020  . Controlled type 2 diabetes mellitus without complication, without long-term current use of insulin ([place]) 06/25/2020  . Asymptomatic varicose veins of right lower extremity 01/17/2014  . Skin rash 01/17/2014  . Smoker   . Heavy alcohol use   . VARICOSE VEINS, LOWER EXTREMITIES 09/19/2008      Prior to Admission medications   Medication Sig Start Date End Date Taking? Authorizing  Provider  albuterol (VENTOLIN HFA) 108 (90 Base) MCG/ACT inhaler Inhale 2 puffs into the lungs every 6 (six) hours as needed for wheezing or shortness of breath. 06/25/20   Ria Bush, MD  nicotine (NICODERM CQ) 14 mg/24hr patch Place 1 patch (14 mg total) onto the skin daily. 06/25/20   Ria Bush, MD  oxyCODONE (ROXICODONE) 5 MG immediate release tablet Take 1 tablet (5 mg total) by mouth every 6 (six) hours as needed for severe pain. 07/16/20   Heilingoetter, Cassandra L, PA-C  prochlorperazine (COMPAZINE) 10 MG tablet Take 1 tablet (10 mg total) by mouth every 6 (six) hours as needed. 07/16/20   Heilingoetter, Cassandra L, PA-C  tiotropium (SPIRIVA HANDIHALER) 18 MCG inhalation capsule Place 1 capsule (18 mcg total) into inhaler and inhale daily. 06/25/20   Ria Bush, MD    Allergies Patient has no known allergies.    Social History Social History   Tobacco Use  . Smoking status: Former Smoker    Packs/day: 1.00    Years: 50.00    Pack years: 50.00    Types: Cigarettes    Start date: 12/01/1963    Quit date: 06/24/2020    Years since quitting: 0.0  . Smokeless tobacco: Never Used  . Tobacco comment: stopped 06/24/20  Vaping Use  .  Vaping Use: Never used  Substance Use Topics  . Alcohol use: Not Currently    Comment: Regular-daily beer and wine  . Drug use: No    Review of Systems Patient denies headaches, rhinorrhea, blurry vision, numbness, shortness of breath, chest pain, edema, cough, abdominal pain, nausea, vomiting, diarrhea, dysuria, fevers, rashes or hallucinations unless otherwise stated above in HPI. ____________________________________________   PHYSICAL EXAM:  VITAL SIGNS: Vitals:   07/22/20 0845  BP: 103/83  Pulse: (!) 117  Resp: (!) 35  Temp: (!) 101.3 F (38.5 C)  SpO2: (!) 85%    Constitutional: Alert, but in respiratory distress with hypoxia requiring supplemental o2 Eyes: Conjunctivae are normal.  Head: Atraumatic. Nose: No  congestion/rhinnorhea. Mouth/Throat: Mucous membranes are moist.   Neck: No stridor. Painless ROM.  Cardiovascular: Normal rate, regular rhythm. Grossly normal heart sounds.  Good peripheral circulation. Respiratory:tachypnea, diminished lung sounds throughout Gastrointestinal: Soft with generalized ttp. No distention. No abdominal bruits. No CVA tenderness. Genitourinary: deferred Musculoskeletal: No lower extremity tenderness, trace bilateral le edema edema.  No joint effusions. Neurologic:  Normal speech and language. No gross focal neurologic deficits are appreciated. No facial droop Skin:  Skin is warm, dry and intact. No rash noted. Psychiatric: Mood and affect are normal. Speech and behavior are normal.  ____________________________________________   LABS (all labs ordered are listed, but only abnormal results are displayed)  Results for orders placed or performed during the hospital encounter of 07/22/20 (from the past 24 hour(s))  Lactic acid, plasma     Status: Abnormal   Collection Time: 07/22/20  8:35 AM  Result Value Ref Range   Lactic Acid, Venous 5.5 (HH) 0.5 - 1.9 mmol/L  CBC WITH DIFFERENTIAL     Status: Abnormal   Collection Time: 07/22/20  8:35 AM  Result Value Ref Range   WBC 23.3 (H) 4.0 - 10.5 K/uL   RBC 4.22 4.22 - 5.81 MIL/uL   Hemoglobin 12.7 (L) 13.0 - 17.0 g/dL   HCT 37.8 (L) 39 - 52 %   MCV 89.6 80.0 - 100.0 fL   MCH 30.1 26.0 - 34.0 pg   MCHC 33.6 30.0 - 36.0 g/dL   RDW 13.1 11.5 - 15.5 %   Platelets 448 (H) 150 - 400 K/uL   nRBC 0.2 0.0 - 0.2 %   Neutrophils Relative % 84 %   Neutro Abs 19.6 (H) 1.7 - 7.7 K/uL   Lymphocytes Relative 6 %   Lymphs Abs 1.4 0.7 - 4.0 K/uL   Monocytes Relative 5 %   Monocytes Absolute 1.1 (H) 0 - 1 K/uL   Eosinophils Relative 1 %   Eosinophils Absolute 0.1 0 - 0 K/uL   Basophils Relative 0 %   Basophils Absolute 0.1 0 - 0 K/uL   Immature Granulocytes 4 %   Abs Immature Granulocytes 0.95 (H) 0.00 - 0.07 K/uL    ____________________________________________  EKG My review and personal interpretation at Time: 8:28   Indication: sepsis  Rate: 115  Rhythm: sinus Axis: normal Other: nonspecific st abn, no stemi ____________________________________________  RADIOLOGY  I personally reviewed all radiographic images ordered to evaluate for the above acute complaints and reviewed radiology reports and findings.  These findings were personally discussed with the patient.  Please see medical record for radiology report.  ____________________________________________   PROCEDURES  Procedure(s) performed:  .Critical Care Performed by: Merlyn Lot, MD Authorized by: Merlyn Lot, MD   Critical care provider statement:    Critical care time (minutes):  35  Critical care time was exclusive of:  Separately billable procedures and treating other patients   Critical care was necessary to treat or prevent imminent or life-threatening deterioration of the following conditions:  Sepsis   Critical care was time spent personally by me on the following activities:  Development of treatment plan with patient or surrogate, discussions with consultants, evaluation of patient's response to treatment, examination of patient, obtaining history from patient or surrogate, ordering and performing treatments and interventions, ordering and review of laboratory studies, ordering and review of radiographic studies, pulse oximetry, re-evaluation of patient's condition and review of old charts      Critical Care performed: yes ____________________________________________   INITIAL IMPRESSION / ASSESSMENT AND PLAN / ED COURSE  Pertinent labs & imaging results that were available during my care of the patient were reviewed by me and considered in my medical decision making (see chart for details).   DDX: sepsis, bacteremia, pna, pe, colitis, appendicitis, cholecystitis, uti, stone  Guy Hester is a 69 y.o.  who presents to the ED with presentation as described above.  Patient is critically ill-appearing.  Certainly concerning for sepsis therefore will initiate diagnostic work-up including IV fluids IV antibiotics blood work and imaging.  Clinical Course as of Jul 22 1336  Mon Jul 22, 2020  5176 Lactate is significantly elevated.  Will give 30 cc/kg of IV fluid resuscitation.  Also ordered CT to further evaluate his abdominal pain in the setting of sepsis as well as this effusion.  Given his known malignancy also concerning for PE.   [PR]  1047 Covid is negative.  Had originally ordered CT imaging with contrast due to concern for possible PE and evaluate for acute inflammatory process but due to his AKI this was not approved by radiology due to concern for further renal injury in the setting of contrast.  Nuclear medicine not an option to the patient's respiratory status.  Will CT without contrast.   [PR]  1116 Patient spouse in the room.  Hemodynamics are stabilizing.  On further discussion they were aware of his poor prognosis and stage IV lung cancer.  Been discussion of starting chemotherapy the patient is declining this however.  States they would not want to be intubated would not want to be placed on "life support "but would be agreeable to IV antibiotics and IV fluids.  Primary goal of care is comfort.  Will discuss with hospitalist for admission.   [PR]    Clinical Course User Index [PR] Merlyn Lot, MD    The patient was evaluated in Emergency Department today for the symptoms described in the history of present illness. He/she was evaluated in the context of the global COVID-19 pandemic, which necessitated consideration that the patient might be at risk for infection with the SARS-CoV-2 virus that causes COVID-19. Institutional protocols and algorithms that pertain to the evaluation of patients at risk for COVID-19 are in a state of rapid change based on information released by regulatory  bodies including the CDC and federal and state organizations. These policies and algorithms were followed during the patient's care in the ED.  As part of my medical decision making, I reviewed the following data within the [place] notes reviewed and incorporated, Labs reviewed, notes from prior ED visits and [place] Controlled Substance Database   ____________________________________________   FINAL CLINICAL IMPRESSION(S) / ED DIAGNOSES  Final diagnoses:  Sepsis with acute renal failure and septic shock, due to unspecified organism, unspecified acute renal failure  type [facility])      NEW MEDICATIONS STARTED DURING THIS VISIT:  New Prescriptions   No medications on file     Note:  This document was prepared using Dragon voice recognition software and may include unintentional dictation errors.    Merlyn Lot, MD 07/22/20 [phone]

## 2020-07-23 ENCOUNTER — Inpatient Hospital Stay: Payer: Medicare Other

## 2020-07-23 DIAGNOSIS — R6520 Severe sepsis without septic shock: Secondary | ICD-10-CM

## 2020-07-23 DIAGNOSIS — Z7189 Other specified counseling: Secondary | ICD-10-CM

## 2020-07-23 DIAGNOSIS — F17200 Nicotine dependence, unspecified, uncomplicated: Secondary | ICD-10-CM

## 2020-07-23 DIAGNOSIS — R6521 Severe sepsis with septic shock: Secondary | ICD-10-CM

## 2020-07-23 DIAGNOSIS — C3491 Malignant neoplasm of unspecified part of right bronchus or lung: Secondary | ICD-10-CM

## 2020-07-23 DIAGNOSIS — Z66 Do not resuscitate: Secondary | ICD-10-CM

## 2020-07-23 DIAGNOSIS — N179 Acute kidney failure, unspecified: Secondary | ICD-10-CM

## 2020-07-23 DIAGNOSIS — E119 Type 2 diabetes mellitus without complications: Secondary | ICD-10-CM

## 2020-07-23 DIAGNOSIS — A419 Sepsis, unspecified organism: Principal | ICD-10-CM

## 2020-07-23 DIAGNOSIS — J449 Chronic obstructive pulmonary disease, unspecified: Secondary | ICD-10-CM

## 2020-07-23 DIAGNOSIS — Z515 Encounter for palliative care: Secondary | ICD-10-CM

## 2020-07-23 LAB — PATHOLOGIST SMEAR REVIEW

## 2020-07-23 LAB — COMPREHENSIVE METABOLIC PANEL
ALT: 199 U/L — ABNORMAL HIGH (ref 0–44)
AST: 314 U/L — ABNORMAL HIGH (ref 15–41)
Albumin: 1.9 g/dL — ABNORMAL LOW (ref 3.5–5.0)
Alkaline Phosphatase: 177 U/L — ABNORMAL HIGH (ref 38–126)
Anion gap: 14 (ref 5–15)
BUN: 76 mg/dL — ABNORMAL HIGH (ref 8–23)
CO2: 18 mmol/L — ABNORMAL LOW (ref 22–32)
Calcium: 7.1 mg/dL — ABNORMAL LOW (ref 8.9–10.3)
Chloride: 99 mmol/L (ref 98–111)
Creatinine, Ser: 2.21 mg/dL — ABNORMAL HIGH (ref 0.61–1.24)
GFR calc Af Amer: 34 mL/min — ABNORMAL LOW (ref >60)
GFR calc non Af Amer: 30 mL/min — ABNORMAL LOW (ref >60)
Glucose, Bld: 191 mg/dL — ABNORMAL HIGH (ref 70–99)
Potassium: 4.3 mmol/L (ref 3.5–5.1)
Sodium: 131 mmol/L — ABNORMAL LOW (ref 135–145)
Total Bilirubin: 2 mg/dL — ABNORMAL HIGH (ref 0.3–1.2)
Total Protein: 4.9 g/dL — ABNORMAL LOW (ref 6.5–8.1)

## 2020-07-23 LAB — CBC
HCT: 32.2 % — ABNORMAL LOW (ref 39.0–52.0)
Hemoglobin: 10.9 g/dL — ABNORMAL LOW (ref 13.0–17.0)
MCH: 30.4 pg (ref 26.0–34.0)
MCHC: 33.9 g/dL (ref 30.0–36.0)
MCV: 89.7 fL (ref 80.0–100.0)
Platelets: 315 10*3/uL (ref 150–400)
RBC: 3.59 MIL/uL — ABNORMAL LOW (ref 4.22–5.81)
RDW: 13.2 % (ref 11.5–15.5)
WBC: 25.5 10*3/uL — ABNORMAL HIGH (ref 4.0–10.5)
nRBC: 0.2 % (ref 0.0–0.2)

## 2020-07-23 LAB — APTT: aPTT: 27 seconds (ref 24–36)

## 2020-07-23 LAB — URINE CULTURE: Culture: NO GROWTH

## 2020-07-23 LAB — PROTIME-INR
INR: 1.2 (ref 0.8–1.2)
Prothrombin Time: 14.6 seconds (ref 11.4–15.2)

## 2020-07-23 LAB — PROTEIN, BODY FLUID (OTHER): Total Protein, Body Fluid Other: 2.1 g/dL

## 2020-07-23 LAB — GLUCOSE, CAPILLARY: Glucose-Capillary: 194 mg/dL — ABNORMAL HIGH (ref 70–99)

## 2020-07-23 LAB — LACTIC ACID, PLASMA: Lactic Acid, Venous: 2.7 mmol/L (ref 0.5–1.9)

## 2020-07-23 MED ORDER — POLYVINYL ALCOHOL 1.4 % OP SOLN
1.0000 [drp] | Freq: Four times a day (QID) | OPHTHALMIC | Status: DC | PRN
  Filled 2020-07-23: qty 15

## 2020-07-23 MED ORDER — BIOTENE DRY MOUTH MT LIQD
15.0000 mL | OROMUCOSAL | Status: DC | PRN
  Filled 2020-07-23: qty 15

## 2020-07-23 MED ORDER — LORAZEPAM 1 MG PO TABS: 1.0000 mg | ORAL_TABLET | ORAL | Status: DC | PRN

## 2020-07-23 MED ORDER — VANCOMYCIN VARIABLE DOSE PER UNSTABLE RENAL FUNCTION (PHARMACIST DOSING): Status: DC

## 2020-07-23 MED ORDER — HYDROMORPHONE HCL 1 MG/ML IJ SOLN: 1.0000 mg | INTRAMUSCULAR | Status: DC | PRN

## 2020-07-23 MED ORDER — BACID PO TABS
2.0000 | ORAL_TABLET | Freq: Three times a day (TID) | ORAL | Status: DC
  Filled 2020-07-23 (×3): qty 2

## 2020-07-23 MED ORDER — LORAZEPAM 2 MG/ML IJ SOLN
1.0000 mg | INTRAMUSCULAR | Status: DC | PRN
  Administered 2020-07-23: 1 mg via INTRAVENOUS
  Filled 2020-07-23: qty 1

## 2020-07-23 MED ORDER — LACTATED RINGERS IV BOLUS (SEPSIS)
1000.0000 mL | Freq: Once | INTRAVENOUS | Status: AC
  Administered 2020-07-23: 1000 mL via INTRAVENOUS

## 2020-07-23 MED ORDER — IPRATROPIUM BROMIDE 0.02 % IN SOLN: 0.5000 mg | Freq: Four times a day (QID) | RESPIRATORY_TRACT | Status: DC | PRN

## 2020-07-23 MED ORDER — POLYETHYLENE GLYCOL 3350 17 G PO PACK: 17.0000 g | PACK | Freq: Two times a day (BID) | ORAL | Status: DC | PRN

## 2020-07-23 MED ORDER — GLYCOPYRROLATE 0.2 MG/ML IJ SOLN
0.3000 mg | INTRAMUSCULAR | Status: DC | PRN
  Filled 2020-07-23: qty 1.5

## 2020-07-23 NOTE — ED Notes (Signed)
Pt with urge to have a BM. Pt was placed on a bedpan and instructed to use call light when finished. Call light within reach of pt.

## 2020-07-23 NOTE — TOC Transition Note (Signed)
Transition of Care [facility]) - CM/SW Discharge Note   Patient Details  Name: Guy Hester MRN: [id number] Date of Birth: 1951/10/21  [facility] CM/SW Contact:  Ova Freshwater Phone Number: [phone] 07/23/2020, 12:08 PM   Clinical Narrative:      Patient presents to [facility] ED due to generalized weakness.  TOC consult for hospice care.  CSW spoke with Katrina Stack, [facility]. Patient has bed offer at [facility], and will d/c this afternoon.  Kieth Brightly RN update ED/RN with disposition plan.  TOC consult complete.        Patient Goals and CMS Choice        Discharge Placement                       Discharge Plan and Services                                     Social Determinants of Health (SDOH) Interventions     Readmission Risk Interventions No flowsheet data found.

## 2020-07-23 NOTE — ED Notes (Signed)
Hospice RN stated she would be calling report to the [facility] facility.

## 2020-07-23 NOTE — TOC Initial Note (Deleted)
Transition of Care [facility]) - Initial/Assessment Note    Patient Details  Name: Guy Hester MRN: [id number] Date of Birth: Jun 27, 1951  [facility]) CM/SW Contact:    Ova Freshwater Phone Number: [phone] 07/23/2020, 12:02 PM  Clinical Narrative:                  Patient presents to [facility] ED due to generalized weakness.  TOC consult for hospice care.  CSW spoke with Katrina Stack, [facility]. Patient has bed offer at [facility], and will d/c this afternoon.  Kieth Brightly RN update ED/RN with disposition plan.  TOC consult complete.        Patient Goals and CMS Choice        Expected Discharge Plan and Services           Expected Discharge Date: 07/23/20                                    Prior Living Arrangements/Services                       Activities of Daily Living      Permission Sought/Granted                  Emotional Assessment              Admission diagnosis:  Sepsis [facility]) [A41.9] Patient Active Problem List   Diagnosis Date Noted  . Sepsis ([place]) 07/22/2020  . Primary lung small cell carcinoma, right ([place]) 07/16/2020  . Small cell lung cancer ([place]) 07/16/2020  . Mass of right lung 06/26/2020  . Medicare annual wellness visit, initial 06/25/2020  . Goals of care, counseling/discussion 06/25/2020  . COPD (chronic obstructive pulmonary disease) ([place]) 06/25/2020  . Controlled type 2 diabetes mellitus without complication, without long-term current use of insulin ([place]) 06/25/2020  . Asymptomatic varicose veins of right lower extremity 01/17/2014  . Skin rash 01/17/2014  . Smoker   . Heavy alcohol use   . VARICOSE VEINS, LOWER EXTREMITIES 09/19/2008   PCP:  Ria Bush, MD Pharmacy:   [facility], [place] [address], [place] [address] Phone: [phone] Fax: [phone]  [id number] [place], [place]  [place] [address] Phone: [phone] Fax: [phone]  [place], [place] - [address] [place] [address] Phone: [phone] Fax: [phone]     Social Determinants of Health (SDOH) Interventions    Readmission Risk Interventions No flowsheet data found.

## 2020-07-23 NOTE — ED Notes (Signed)
No change in condition, will continue to monitor.  

## 2020-07-23 NOTE — ED Notes (Signed)
Pt appears to be sleeping and comfortable at this time.

## 2020-07-23 NOTE — Progress Notes (Signed)
[facility] hospital liaison note:  Report called to the hospice home, EMS notified for transport. Signed out of facility DNR in place in discharge packet. Hospital care team updated. Thank you. Flo Shanks BSN, RN, [place] [phone].

## 2020-07-23 NOTE — ED Notes (Signed)
Report received from [facility]. Patient care assumed. Patient/RN introduction complete. Will continue to monitor.

## 2020-07-23 NOTE — Progress Notes (Addendum)
[facility] Room ED [address] [facility]) Hospital Liaison RN note:  Received request from Jobe Gibbon, NP for family interest in [facility]. Chart reviewed and eligibility has been approved . Spoke with spouse Mickel Baas in the room to initiate education related to hospice philosophy and services provided. She verbalized understanding and had no questions.  Hospice Home does have a bed available today. Registration paperwork to be completed by spouse Mickel Baas today at 12:30pm. I will call for EMS once complete and call report to the Hospice as well as fax the discharge summary.  Please call with any hospice related questions or concerns.  Thank you for the opportunity to participate in this patient's care.  Zandra Abts, RN [facility] Liaison [phone]

## 2020-07-23 NOTE — Consult Note (Signed)
Consultation Note Date: 07/23/2020   Patient Name: Guy Hester  DOB: March 01, 1951  MRN: [id number]  Age / Sex: 69 y.o., male   PCP: Ria Bush, MD Referring Physician: Deatra James, MD   REASON FOR CONSULTATION:Establishing goals of care  Palliative Care consult requested for goals of care discussion in this 69 y.o. 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  Patient presented to the ED from home with complaints of generalized weakness and shortness of breath.  During work-up lab work showed sodium 129, creatinine 2.89, AST 296, ALT 195, WBC 23, lactic acid 5.5.  Chest x-ray showed large right pleural effusion.  CT of chest, abdomen pelvis showed multiple areas of metastatic disease as well as large right pleural effusion.  Clinical Assessment and Goals of Care: I have reviewed medical records including lab results, imaging, Epic notes, and MAR, received report from the bedside RN, and assessed the patient. I met at the bedside with patient and his wife, Guy Hester to discuss diagnosis prognosis, [place], EOL wishes, disposition and options.  I introduced Palliative Medicine as specialized medical care for people living with serious illness. It focuses on providing relief from the symptoms and stress of a serious illness. The goal is to improve quality of life for both the patient and the family.  Wife verbalized understanding and appreciation.  Mr. Guy Hester experiencing some anxiety due to tachypnea.  States he is unable to take deep breath.  Complains of abdominal pain.  Wife also reporting intermittent episodes of confusion/hallucinations.  We discussed a brief life review of the patient, along with his functional and nutritional status. Guy Hester reports they have been married for 35 years.  Patient has 3 children from previous marriage.  Wife reports a month ago  patient was able to perform ADLs independently and working from his computer.  However over the past several weeks he has become more deconditioned, requires significant assistance with ADLs.  She reports significant decrease in appetite. Patient would only take sips of drink and small bites of food.  Wife reports patient was unable to stand prior to admission due to significant weakness and shortness of breath.  Denies use of home oxygen.  We discussed His current illness and what it means in the larger context of His on-going co-morbidities. With specific discussions regarding metastatic cancer and patient's overall functional and nutritional decline.  Natural disease trajectory and expectations at EOL were discussed.  Wife is tearful and expressing her understanding of patient's metastatic cancer and poor prognosis.  Patient was originally scheduled for chemotherapy however they have decided to discontinue treatment.  Patient expresses his wishes to be kept comfortable and to not continue in his "measurable state".  Therapeutic listening and support provided.  I attempted to elicit values and goals of care important to the patient.    Wife is tearful expressing patient's wishes.  She agrees and does not wish for her husband to continue to suffer.  Patient and wife expresses wishes for care to focus on his comfort and spend what time he has left with family and friends in a peaceful and restful state.  Support provided.  The difference between aggressive medical intervention and comfort care was considered in light of the patient's goals of care. I educated patient/family on what comfort care measures would look like.  Wife is aware patient would no longer receive aggressive medical interventions such  as continuous vital signs, lab work, radiology testing, or medications not focused on comfort. This would include management of any symptoms that may cause discomfort, pain, shortness of breath, cough,  nausea, agitation, anxiety, and/or secretions etc. Symptoms would be managed with medications and other non-pharmacological interventions such as spiritual support if requested, repositioning, music therapy, or therapeutic listening. Family verbalized understanding and appreciation.   Wife verbalizes wishes to proceed with transitioning patient's care to full comfort/EOL.  She confirms DNR/DNI.  Hospice services outpatient were explained and offered. Patient and wife verbalized their understanding and awareness of hospice's goals and philosophy of care.  Education provided regarding hospice home versus hospice support at home.  Wife verbalizes wishes for residential facility.  Education provided regarding referral process and bed availability.  Questions and concerns were addressed. The family was encouraged to call with questions or concerns.  PMT will continue to support holistically.   SOCIAL HISTORY:     reports that he quit smoking about 4 weeks ago. His smoking use included cigarettes. He started smoking about 56 years ago. He has a 50.00 pack-year smoking history. He has never used smokeless tobacco. He reports previous alcohol use. He reports that he does not use drugs.  CODE STATUS: DNR  ADVANCE DIRECTIVES: Guy Hester Wife/HCPOA   SYMPTOM MANAGEMENT: see below   Palliative Prophylaxis:   Aspiration, Delirium Protocol, Eye Care, Frequent Pain Assessment and Oral Care  PSYCHO-SOCIAL/SPIRITUAL:  Support System: Family  Desire for further Chaplaincy support:NO   Additional Recommendations (Limitations, Scope, Preferences):  Full Comfort Care  Education on hospice   PAST MEDICAL HISTORY: Past Medical History:  Diagnosis Date  . COPD (chronic obstructive pulmonary disease) ([place])   . Dyspnea   . Heavy alcohol use   . History of asthma    childhood  . History of chicken pox   . Smoker     ALLERGIES:  has No Known Allergies.   MEDICATIONS:  Current  Facility-Administered Medications  Medication Dose Route Frequency Provider Last Rate Last Admin  . acetaminophen (TYLENOL) tablet 650 mg  650 mg Oral Q6H PRN Clarnce Flock, MD       Or  . acetaminophen (TYLENOL) suppository 650 mg  650 mg Rectal Q6H PRN Clarnce Flock, MD      . albuterol (PROVENTIL) (2.5 MG/3ML) 0.083% nebulizer solution 2.5 mg  2.5 mg Nebulization Q6H Clarnce Flock, MD   2.5 mg at 07/23/20 0815  . antiseptic oral rinse (BIOTENE) solution 15 mL  15 mL Topical PRN Pickenpack-Cousar, Cashus Halterman N, NP      . glycopyrrolate (ROBINUL) injection 0.3 mg  0.3 mg Intravenous Q4H PRN Pickenpack-Cousar, Nai Dasch N, NP      . HYDROmorphone (DILAUDID) injection 1-2 mg  1-2 mg Intravenous Q1H PRN Pickenpack-Cousar, Kienan Doublin N, NP      . ipratropium (ATROVENT) nebulizer solution 0.5 mg  0.5 mg Nebulization Q6H PRN Pickenpack-Cousar, Michelle Wnek N, NP      . LORazepam (ATIVAN) tablet 1 mg  1 mg Oral Q4H PRN Pickenpack-Cousar, Carlena Sax, NP       Or  . LORazepam (ATIVAN) injection 1 mg  1 mg Intravenous Q4H PRN Pickenpack-Cousar, Nashira Mcglynn N, NP   1 mg at 07/23/20 1150  . mineral oil enema 1 enema  1 enema Rectal Once Clarnce Flock, MD      . nicotine (NICODERM CQ - dosed in mg/24 hours) patch 14 mg  14 mg Transdermal Daily Clarnce Flock, MD      .  ondansetron (ZOFRAN) injection 4 mg  4 mg Intravenous Q6H PRN Clarnce Flock, MD      . oxyCODONE (Oxy IR/ROXICODONE) immediate release tablet 5 mg  5 mg Oral Q4H PRN Clarnce Flock, MD      . polyethylene glycol (MIRALAX / GLYCOLAX) packet 17 g  17 g Oral BID PRN Pickenpack-Cousar, Lee-Anne Flicker N, NP      . polyvinyl alcohol (LIQUIFILM TEARS) 1.4 % ophthalmic solution 1 drop  1 drop Both Eyes QID PRN Pickenpack-Cousar, Violia Knopf N, NP      . sodium chloride flush (NS) 0.9 % injection 3 mL  3 mL Intravenous Q12H Clarnce Flock, MD   3 mL at 07/22/20 2007  . traZODone (DESYREL) tablet 50 mg  50 mg Oral QHS PRN Clarnce Flock, MD        Current Outpatient Medications  Medication Sig Dispense Refill  . oxyCODONE (ROXICODONE) 5 MG immediate release tablet Take 1 tablet (5 mg total) by mouth every 6 (six) hours as needed for severe pain. 30 tablet 0  . prochlorperazine (COMPAZINE) 10 MG tablet Take 1 tablet (10 mg total) by mouth every 6 (six) hours as needed. 30 tablet 2  . tiotropium (SPIRIVA HANDIHALER) 18 MCG inhalation capsule Place 1 capsule (18 mcg total) into inhaler and inhale daily. 30 capsule 12  . albuterol (VENTOLIN HFA) 108 (90 Base) MCG/ACT inhaler Inhale 2 puffs into the lungs every 6 (six) hours as needed for wheezing or shortness of breath. 18 g 3  . nicotine (NICODERM CQ) 14 mg/24hr patch Place 1 patch (14 mg total) onto the skin daily. 28 patch 1    VITAL SIGNS: BP 107/77   Pulse (!) 108   Temp (!) 97.3 F (36.3 C) (Axillary)   Resp (!) 26   Ht 5' 3.5" (1.613 m)   Wt 82.7 kg   SpO2 94%   BMI 31.79 kg/m  Filed Weights   07/22/20 0843  Weight: 82.7 kg    Estimated body mass index is 31.79 kg/m as calculated from the following:   Height as of this encounter: 5' 3.5" (1.613 m).   Weight as of this encounter: 82.7 kg.  LABS: CBC:    Component Value Date/Time   WBC 25.5 (H) 07/23/2020 0433   HGB 10.9 (L) 07/23/2020 0433   HGB 14.3 07/16/2020 1250   HCT 32.2 (L) 07/23/2020 0433   PLT 315 07/23/2020 0433   PLT 418 (H) 07/16/2020 1250   Comprehensive Metabolic Panel:    Component Value Date/Time   NA 131 (L) 07/23/2020 0433   K 4.3 07/23/2020 0433   BUN 76 (H) 07/23/2020 0433   CREATININE 2.21 (H) 07/23/2020 0433   CREATININE 1.07 07/16/2020 1250   ALBUMIN 1.9 (L) 07/23/2020 0433     Review of Systems  Constitutional: Positive for appetite change and unexpected weight change.  Respiratory: Positive for cough and shortness of breath.   Neurological: Positive for weakness.  Unless otherwise noted, a complete review of systems is negative.  Physical Exam General: some respiratory  distress noted, frail chronically-ill appearing Cardiovascular: Tachycardic Pulmonary: rhonchi bilaterally, shortness of breath with minimal exertion, use of accessory muscles  Abdomen: soft, nontender, + bowel sounds Extremities: no edema, no joint deformities Skin: no rashes, warm and dry Neurological: alert and oriented, mood appropriate, follows commands   Prognosis: < 2 weeks in the setting of metastatic stage IV lung cancer with extensive liver mts, lymph node involvement, bilateral adrenal mets, omental mets, right pleural  and intercostal mets with right mainstem bronchus involvement, right pleural effusion s/p thoracentesis on 8/23 yielding 1.5L, deconditioning, poor po intake, malnutrition, respiratory distress, albumin 1.8.   Discharge Planning:  Hospice facility  Recommendations: . DNR/DNI/Full Code-as confirmed by  . Transition all care to full comfort/EOL as requested by patient and wife.  . Will discontinue all orders not focused on comfort . TOC referral for residential hospice placement. Family expressed wishes for [facility] location. Santiago Glad, [place] Liaison made aware). Dr. Roger Shelter made aware of patient/wife's request. Dilaudid PRN for pain/air hunger/comfort Robinul PRN for excessive secretions Ativan PRN for agitation/anxiety Zofran PRN for nausea Liquifilm tears PRN for dry eyes May have comfort feeding Comfort cart for family Unrestricted visitations in the setting of EOL (per policy) Oxygen PRN 2L or less for comfort. No escalation.  Marland Kitchen PMT will continue to support and follow. Please call team line with urgent needs.   Palliative Performance Scale: PPS 10-20%              Wife expressed understanding and was in agreement with this plan.   Thank you for allowing the Palliative Medicine Team to assist in the care of this patient.  Time In: 1020 Time Out: 1130 Time Total: 70 min.   Visit consisted of counseling and education dealing with the complex and  emotionally intense issues of symptom management and palliative care in the setting of serious and potentially life-threatening illness.Greater than 50%  of this time was spent counseling and coordinating care related to the above assessment and plan.  Signed by:  Alda Lea, AGPCNP-BC Palliative Medicine Team  Phone: [phone] Pager: [phone] Amion: Bjorn Pippin

## 2020-07-23 NOTE — Discharge Summary (Signed)
Physician Discharge Summary [facility]    Patient: Guy Hester                   Admit date: 07/22/2020   DOB: 09/19/1951             Discharge date:07/23/2020/11:39 AM [facility]:[id number]                          PCP: Guy Bush, MD  Disposition: Hospice home  Recommendations for Outpatient Follow-up:   . 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 Large right pleural effusion 1.5 L is contributing significantly to his impaired respiratory status, IR have been consulted for ultrasound-guided drainage. Likely also element of underlying pneumonia from obstruction, will cover broadly with antibiotics. Initial lactate has remained flat from 5.5-5.8, will continue aggressive fluid resuscitation.  -Continue cefepime, Flagyl, vancomycin  -Secure chat w Dr. Annamaria Boots fromIR for drainage of large right pleural  effusion, US Thoracentesis ordered they will try to get done today -Follow-up pleural fluid studies -Follow-up blood cultures -Supplemental oxygen as needed -Lactates remains elevated, lactic acidosis   Abdominal pain Continues to complain of discomfort-pain - CT abdomen and pelvis shows diffuse metastases in the liver, omentum, adrenal glands, and mesenteric lymphadenopathy. There is also note of some retained stool in the large bowel, and sigmoid diverticulosis. Both of these etiologies likely contributing to his discomfort, will treat symptomatically. Some consideration should be given to bowel ischemia given his ongoing lactic acidosis, however he does not have any history of A. fib or other etiology for embolic phenomena and I think this is less likely. If lactic acidosis does not respond to fluids and abdominal pain does not respond to symptomatic treatment however I would pursue this further. -Patient amenable to enema -MiraLAX twice daily -Remained to have elevated lactic acid   Small cell lung cancer Patient and his wife report they no longer wish to pursue palliative treatment for his lung cancer. Discussion of CODE STATUS they have decided to be DNR. Patient is a good candidate for hospice. -Radiation oncology palliative treatment -Oncologist Dr. Earlie Server has had an extensive discussion with patient and family -Consult palliative care -meeting C's treatment, proceed with comfort care measures-hospice  Hyponatremia Sodium 129 admission down from 133 about a week ago, suspect secondary to hypovolemia in the setting of sepsis. SIADH secondary to his small cell lung cancer is another possibility. Will fluid resuscitate and  reassess. -Monitoring  DM2 -CBG QA [facility] -Discontinue continue home regimen Last A1c 6.9% on July 27, will manage with sliding scale insulin for the time being.   AKI Ackley prerenal in the setting of sepsis, receiving 2 L crystalloid bolus,   -Status post aggressive IV fluid resuscitation   Hypocalcemia Calcium 7.4 on admission down from 8.8 last week, will replete IV and recheck CMP later this afternoon.  Transaminitis - Hyperbilirubinemia LFTs and bili normal last month, elevation likely secondary to hypovolemia in the setting of sepsis in addition to his liver metastases seen on imaging. Fluid resuscitate and trend CMP.  Elevated troponin - EKG nonischemic on admission, troponin initially elevated to 41 and downtrending to 26 on recheck 3 hours later. Known CAD seen on CT chest last month. Suspect demand ischemia in the setting of sepsis and underlying CAD, patient has not reported chest pain,defer further work-up at this time.   Ethics: DNR, confirmed >> will transition to comfort care measures-hospice The patient and family has met with palliative care team.  After extensive discussion with palliative care team and oncologist Dr. Earlie Server The patient and family has agreed to comfort care measures, and accepted to go to hospice home.  Disposition-patient is cleared to be discharged to hospice home today.      Discharge Instructions:   Discharge Instructions    Activity as tolerated - No restrictions   Complete by: As directed    Diet - low sodium heart healthy   Complete by: As directed    Discharge instructions   Complete by: As directed    Follow-up with hospice care   Increase activity slowly   Complete by: As directed        Medication List    TAKE these medications   albuterol 108 (90 Base) MCG/ACT inhaler Commonly known as: VENTOLIN HFA Inhale 2 puffs into the lungs every 6 (six) hours as needed for wheezing or shortness of breath.   nicotine 14 mg/24hr patch Commonly known as: Nicoderm CQ Place 1 patch (14 mg total) onto the skin daily.   oxyCODONE 5 MG immediate release tablet Commonly known as: Roxicodone Take 1 tablet (5 mg total) by mouth every 6 (six) hours as needed for  severe pain.   prochlorperazine 10 MG tablet Commonly known as: COMPAZINE Take 1 tablet (10 mg total) by mouth every 6 (six) hours as needed.   Spiriva HandiHaler 18 MCG inhalation capsule Generic drug: tiotropium Place 1 capsule (18 mcg total) into inhaler and inhale daily.       No Known Allergies   Procedures /Studies:   CT ABDOMEN PELVIS WO CONTRAST  Result Date: 07/22/2020 CLINICAL DATA:  69 year old male with shortness of breath. Stage IV lung cancer status post CT-guided biopsy of left chest wall mass earlier this month. Right pleural effusion on portable chest x-ray today. EXAM: CT CHEST, ABDOMEN AND PELVIS WITHOUT CONTRAST TECHNIQUE: Multidetector CT imaging of the chest, abdomen and pelvis was performed following the standard protocol without IV contrast. COMPARISON:  Chest CT 06/26/2020.  CT biopsy images 07/03/2020. FINDINGS: CT CHEST FINDINGS Cardiovascular: Due to acute renal insufficiency the patient was not a good candidate for IV contrast today, and vascular patency is not evaluated in the absence of IV contrast. Calcified coronary artery and aortic atherosclerosis again noted. No pericardial effusion. No cardiomegaly. Mediastinum/Nodes: Bulky mediastinal tumor and/or conglomerate malignant lymphadenopathy virtually encases the carina (series 2, image 23) and has not significantly changed since last month. This is contiguous with  right hilar tumor as before. Contralateral small but conspicuous left hilar lymph nodes present last month also likely persists. Lungs/Pleura: Increased since last month, and now large layering right pleural effusion with simple fluid density (series 2, image 14). Superimposed poorly marginated right lung tumor. An on the IV contrast enhanced CT last month there was also pleural tumor at the right posterolateral 7th and 8th rib levels. And even without contrast today bulky and nodular pleural and/or intercostal tumor is demonstrated in the right  posterior costophrenic angle (series 2, images 52-57). The trachea and carina remain patent but there is increased and now severe mass effect on the right mainstem bronchus and related airways. Subsequently there is worsening right lung ventilation. In the left lung multiple solid-appearing pulmonary nodules have increased by 2-3 mm each since last month, the largest is now 15 mm in the lingula on series 4, image 112 (13 mm last month). Underlying centrilobular emphysema. No left pleural effusion. Musculoskeletal: Chronic right lateral 7th rib fracture. No right rib destruction despite evidence of multifocal right pleural and/or intercostal tumor. No osseous metastasis identified in the thorax. Left chest wall biopsied mass on series 2, image 41 now measures up to 36 mm diameter, previously 30 mm). And there is evidence that right pleural and intercostal tumor may now be invading the chest wall on series 2, image 47. CT ABDOMEN PELVIS FINDINGS Hepatobiliary: Extensive liver metastases appear increased, hypodense and resulting altering the contour of the liver in multiple areas. Increased perihepatic 2 cm superior diaphragmatic malignant lymph nodes versus omental metastases (series 2, image 46). Negative gallbladder. Pancreas: Negative. Spleen: Trace perisplenic fluid is new. Similar perisplenic superior diaphragmatic nodal or omental metastases (series 2, image 52). Adrenals/Urinary Tract: Bilateral adrenal metastases, stable to slightly increased since last month, now roughly 46 mm diameter on the right and 21 mm on the left (44 mm and 21 mm respectively). Nonobstructed kidneys, with a negative noncontrast appearance. Proximal ureters are decompressed. Unremarkable urinary bladder. Incidental pelvic phleboliths. Stomach/Bowel: No dilated large or small bowel. Sigmoid diverticulosis. Some retained stool in the large bowel. Bulky mesenteric lymphadenopathy suspected in the left small bowel mesentery on series 2,  image 86,. And there appears to be progressed omental metastases in the left upper quadrant adjacent to the spleen on series 2, image 52. No free air.  No free fluid. Vascular/Lymphatic: Vascular patency is not evaluated in the absence of IV contrast. Aortoiliac calcified atherosclerosis. Increased right upper retroperitoneal lymphadenopathy in proximity to the right costophrenic angle and adrenal metastases. Left small bowel mesenteric lymphadenopathy suspected as above. Reproductive: Small fat containing right inguinal hernia. Otherwise negative. Other: No pelvic free fluid. Musculoskeletal: No destructive bone metastasis identified in the lumbosacral spine, although there are some vague areas of suspicious altered bone mineralization, including the S2 body on series 6, image 127. Likewise, no destructive pelvic or proximal femur bone metastasis. IMPRESSION: 1. Noncontrast exam today due to new renal insufficiency, vascular patency not evaluated. 2. Progression of stage IV cancer since last month, presumed right lung primary. 3. Worsening ventilation in the right lung in part due to increased and now moderate to large right pleural effusion in the setting of right pleural and intercostal metastases. There is also increased mass effect on the right mainstem bronchus. 4. Aortic Atherosclerosis (ICD10-I70.0) and Emphysema (ICD10-J43.9). Electronically Signed   By: Genevie Ann M.D.   On: 07/22/2020 11:34   DG Chest 1 View  Result Date: 07/22/2020 CLINICAL DATA:  Pleural effusion, post thoracentesis  EXAM: CHEST  1 VIEW COMPARISON:  07/22/2020 FINDINGS: Slight decrease in the right pleural effusion which remains moderate. There is question of a small right lateral pneumothorax which may be related to stuck lung. Right perihilar and upper lobe masslike opacity noted. Left lung clear. Small nodules seen in the left lung by CT not appreciable by plain film. Heart is normal size. IMPRESSION: Moderate right pleural effusion,  decreased following thoracentesis. Question small lateral right pneumothorax or failure of the right lung to re-expand (stuck lung). Electronically Signed   By: Rolm Baptise M.D.   On: 07/22/2020 15:09   DG Chest 2 View  Result Date: 06/25/2020 CLINICAL DATA:  Dyspnea, smoker, COPD EXAM: CHEST - 2 VIEW COMPARISON:  08/29/2010 FINDINGS: The heart size and mediastinal contours are within normal limits. There is a small right pleural effusion with fluid in the minor fissure and a dense, masslike opacity of the perihilar right lung. Left lung is hyperinflated although normally aerated. The visualized skeletal structures are unremarkable. IMPRESSION: There is a small right pleural effusion with fluid in the minor fissure and a dense, masslike opacity of the perihilar right lung, findings concerning for malignancy, although infection is a differential consideration. Recommend CT to further evaluate. These results will be called to the ordering clinician or representative by the Radiologist Assistant, and communication documented in the PACS or [employer]. Electronically Signed   By: Eddie Candle M.D.   On: 06/25/2020 09:54   CT CHEST WO CONTRAST  Result Date: 07/22/2020 CLINICAL DATA:  69 year old male with shortness of breath. Stage IV lung cancer status post CT-guided biopsy of left chest wall mass earlier this month. Right pleural effusion on portable chest x-ray today. EXAM: CT CHEST, ABDOMEN AND PELVIS WITHOUT CONTRAST TECHNIQUE: Multidetector CT imaging of the chest, abdomen and pelvis was performed following the standard protocol without IV contrast. COMPARISON:  Chest CT 06/26/2020.  CT biopsy images 07/03/2020. FINDINGS: CT CHEST FINDINGS Cardiovascular: Due to acute renal insufficiency the patient was not a good candidate for IV contrast today, and vascular patency is not evaluated in the absence of IV contrast. Calcified coronary artery and aortic atherosclerosis again noted. No pericardial  effusion. No cardiomegaly. Mediastinum/Nodes: Bulky mediastinal tumor and/or conglomerate malignant lymphadenopathy virtually encases the carina (series 2, image 23) and has not significantly changed since last month. This is contiguous with right hilar tumor as before. Contralateral small but conspicuous left hilar lymph nodes present last month also likely persists. Lungs/Pleura: Increased since last month, and now large layering right pleural effusion with simple fluid density (series 2, image 14). Superimposed poorly marginated right lung tumor. An on the IV contrast enhanced CT last month there was also pleural tumor at the right posterolateral 7th and 8th rib levels. And even without contrast today bulky and nodular pleural and/or intercostal tumor is demonstrated in the right posterior costophrenic angle (series 2, images 52-57). The trachea and carina remain patent but there is increased and now severe mass effect on the right mainstem bronchus and related airways. Subsequently there is worsening right lung ventilation. In the left lung multiple solid-appearing pulmonary nodules have increased by 2-3 mm each since last month, the largest is now 15 mm in the lingula on series 4, image 112 (13 mm last month). Underlying centrilobular emphysema. No left pleural effusion. Musculoskeletal: Chronic right lateral 7th rib fracture. No right rib destruction despite evidence of multifocal right pleural and/or intercostal tumor. No osseous metastasis identified in the thorax. Left chest wall biopsied  mass on series 2, image 41 now measures up to 36 mm diameter, previously 30 mm). And there is evidence that right pleural and intercostal tumor may now be invading the chest wall on series 2, image 47. CT ABDOMEN PELVIS FINDINGS Hepatobiliary: Extensive liver metastases appear increased, hypodense and resulting altering the contour of the liver in multiple areas. Increased perihepatic 2 cm superior diaphragmatic malignant  lymph nodes versus omental metastases (series 2, image 46). Negative gallbladder. Pancreas: Negative. Spleen: Trace perisplenic fluid is new. Similar perisplenic superior diaphragmatic nodal or omental metastases (series 2, image 52). Adrenals/Urinary Tract: Bilateral adrenal metastases, stable to slightly increased since last month, now roughly 46 mm diameter on the right and 21 mm on the left (44 mm and 21 mm respectively). Nonobstructed kidneys, with a negative noncontrast appearance. Proximal ureters are decompressed. Unremarkable urinary bladder. Incidental pelvic phleboliths. Stomach/Bowel: No dilated large or small bowel. Sigmoid diverticulosis. Some retained stool in the large bowel. Bulky mesenteric lymphadenopathy suspected in the left small bowel mesentery on series 2, image 86,. And there appears to be progressed omental metastases in the left upper quadrant adjacent to the spleen on series 2, image 52. No free air.  No free fluid. Vascular/Lymphatic: Vascular patency is not evaluated in the absence of IV contrast. Aortoiliac calcified atherosclerosis. Increased right upper retroperitoneal lymphadenopathy in proximity to the right costophrenic angle and adrenal metastases. Left small bowel mesenteric lymphadenopathy suspected as above. Reproductive: Small fat containing right inguinal hernia. Otherwise negative. Other: No pelvic free fluid. Musculoskeletal: No destructive bone metastasis identified in the lumbosacral spine, although there are some vague areas of suspicious altered bone mineralization, including the S2 body on series 6, image 127. Likewise, no destructive pelvic or proximal femur bone metastasis. IMPRESSION: 1. Noncontrast exam today due to new renal insufficiency, vascular patency not evaluated. 2. Progression of stage IV cancer since last month, presumed right lung primary. 3. Worsening ventilation in the right lung in part due to increased and now moderate to large right pleural  effusion in the setting of right pleural and intercostal metastases. There is also increased mass effect on the right mainstem bronchus. 4. Aortic Atherosclerosis (ICD10-I70.0) and Emphysema (ICD10-J43.9). Electronically Signed   By: Genevie Ann M.D.   On: 07/22/2020 11:34   CT Chest W Contrast  Result Date: 06/26/2020 CLINICAL DATA:  Abnormal chest radiograph. EXAM: CT CHEST WITH CONTRAST TECHNIQUE: Multidetector CT imaging of the chest was performed during intravenous contrast administration. CONTRAST:  25m OMNIPAQUE IOHEXOL 300 MG/ML  SOLN COMPARISON:  Chest radiograph 06/25/2020. FINDINGS: Cardiovascular: Atherosclerotic calcification of the aorta and coronary arteries. Right upper lobe pulmonary artery is encased by a soft tissue mass. Heart size normal. No pericardial effusion. Mediastinum/Nodes: Low internal jugular lymph nodes are seen bilaterally, measuring up to 10 mm on the right (2/21). Conglomerate nodal mass in the low right paratracheal station measures 5.9 x 8.0 cm. Mass is contiguous with heterogeneous right hilar masslike soft tissue. There is obstruction of the right upper lobe bronchus. Left paratracheal lymph node measures 9 mm. No axillary adenopathy. Esophagus is grossly unremarkable. Lungs/Pleura: Centrilobular and paraseptal emphysema. Masslike consolidation in the anterior and posterior segments of the right upper lobe is difficult to measure and is contiguous with conglomerate nodal right paratracheal/right hilar nodal mass. There are multiple bilateral pulmonary nodules. Index left upper lobe nodule measures 5 mm (3/57). Moderate right pleural effusion with extensive pleural nodularity at the base of the right hemithorax. Extrapleural lymph nodes in the inferior anterior right  hemithorax measure up to 12 mm. Narrowing of the bronchus intermedius, in addition to obstruction of the right upper lobe bronchus. Upper Abdomen: Liver margin is irregular. There are multiple subtle heterogeneous  low-attenuation masses bilaterally, measuring up to 2.1 x 3.5 cm, likely within segment 4 of the liver (2/143). Visualized portion of the gallbladder is unremarkable. Right adrenal mass measures 3.0 x 3.6 cm. Left adrenal nodule measures 2.1 x 2.1 cm. 10 mm low-attenuation lesion in the medial left kidney is too small to characterize. Kidneys and spleen are unremarkable. There may be a vague area of low attenuation in the pancreatic body, measuring 1.8 cm (2/167). Visualized portions of the stomach and bowel are grossly unremarkable. Upper abdominal adenopathy measures up to 2.7 cm in short axis in the gastrohepatic ligament. Musculoskeletal: Degenerative changes in the spine. No worrisome lytic or sclerotic lesions. IMPRESSION: 1. Stage IV lung cancer as evidenced by conglomerate right paratracheal/right hilar adenopathy and masslike consolidation in the right upper lobe with pleuroparenchymal, hepatic, adrenal and abdominal nodal metastases. 2. Vague area of low attenuation in the pancreatic body. Difficult to exclude metastatic disease. 3. Cirrhosis. 4. Aortic atherosclerosis (ICD10-I70.0). Coronary artery calcification. 5.  Emphysema (ICD10-J43.9). Electronically Signed   By: Lorin Picket M.D.   On: 06/26/2020 14:19   CT BIOPSY  Result Date: 07/03/2020 INDICATION: 69 year old male with a history of likely lung cancer with metastases referred for biopsy EXAM: IMAGE GUIDED BIOPSY LEFT CHEST WALL MASS MEDICATIONS: None. ANESTHESIA/SEDATION: Moderate (conscious) sedation was employed during this procedure. A total of Versed 1.5 mg and Fentanyl 0 mcg was administered intravenously. Moderate Sedation Time: 0 minutes. The patient's level of consciousness and vital signs were monitored continuously by radiology nursing throughout the procedure under my direct supervision. FLUOROSCOPY TIME:  CT COMPLICATIONS: None PROCEDURE: Informed written consent was obtained from the patient after a thorough discussion of the  procedural risks, benefits and alternatives. All questions were addressed. Maximal Sterile Barrier Technique was utilized including caps, mask, sterile gowns, sterile gloves, sterile drape, hand hygiene and skin antiseptic. A timeout was performed prior to the initiation of the procedure. Patient positioned supine position on the CT gantry table. Scout CT acquired for planning purposes. Once we confirmed the left chest wall lesion, the correlate was identified with ultrasound. The patient was then prepped and draped in the usual sterile fashion. 1% lidocaine was used for local anesthesia. Ultrasound guidance was used then 4 four separate 16 gauge core biopsy. Tissue placed into formalin. Sterile bandage was placed. Patient tolerated the procedure well and remained hemodynamically stable throughout. No complications were encountered and no significant blood loss. IMPRESSION: Status post image guided biopsy of left chest wall mass. Tissue specimen sent to pathology for complete histopathologic analysis. Signed, Dulcy Fanny. Dellia Nims, RPVI [facility] Radiology Electronically Signed   By: Corrie Mckusick D.O.   On: 07/03/2020 12:02   DG Chest Port 1 View  Result Date: 07/22/2020 CLINICAL DATA:  Shortness of breath EXAM: PORTABLE CHEST 1 VIEW COMPARISON:  June 25, 2020 FINDINGS: There is a moderate pleural effusion on the right with underlying atelectasis and consolidation throughout the right mid lower lung zones and to a lesser extent in the right upper lobe. Left lung is clear. Heart size is normal. Pulmonary vascularity on the left peers normal. Pulmonary vascularity on the right is partially obscured by effusion and infiltrate. No adenopathy is appreciable with limitations on the right due to overlying fluid and infiltrate. No bone lesions  evident. IMPRESSION: Moderate pleural effusion on the right with underlying atelectasis and presumed consolidation right mid and  lower lung regions and to a lesser degree in the right upper lobe. Left lung clear. Cardiac silhouette appears stable compared to recent study. Electronically Signed   By: Lowella Grip III M.D.   On: 07/22/2020 08:45   US THORACENTESIS ASP PLEURAL SPACE W/IMG GUIDE  Result Date: 07/22/2020 INDICATION: Metastatic lung cancer. Shortness of breath. Right sided pleural effusion EXAM: ULTRASOUND GUIDED RIGHT THORACENTESIS MEDICATIONS: 1% plain lidocaine, 5 mL COMPLICATIONS: None immediate. PROCEDURE: An ultrasound guided thoracentesis was thoroughly discussed with the patient and questions answered. The benefits, risks, alternatives and complications were also discussed. The patient understands and wishes to proceed with the procedure. Written consent was obtained. Ultrasound was performed to localize and mark an adequate pocket of fluid in the right chest. The area was then prepped and draped in the normal sterile fashion. 1% Lidocaine was used for local anesthesia. Under ultrasound guidance a 6 Fr Safe-T-Centesis catheter was introduced. Thoracentesis was performed. The catheter was removed and a dressing applied. FINDINGS: A total of approximately 1.5 L of hazy amber fluid was removed. Samples were sent to the laboratory as requested by the clinical team. IMPRESSION: Successful ultrasound guided right thoracentesis yielding 1.5 L of pleural fluid. Read by: Ascencion Dike PA-C Electronically Signed   By: Jerilynn Mages.  Shick M.D.   On: 07/22/2020 15:21     Subjective:   Patient was seen and examined 07/23/2020, 11:39 AM Patient stable today. No acute distress.  No issues overnight Stable for discharge.  Discharge Exam:    Vitals:   07/23/20 0700 07/23/20 0715 07/23/20 0730 07/23/20 0830  BP: 119/69  103/69 107/77  Pulse: 89  (!) 41 (!) 108  Resp: (!) 26  16 (!) 26  Temp:      TempSrc:      SpO2: (!) 88% 100% 99% 94%  Weight:      Height:        General: In moderate acute distress, and shortness of  breath,  Cardiovascular: S1 & S2 heard, RRR, S1/S2 +. No murmurs, rubs, gallops or clicks. No JVD or pedal edema. Respiratory: Extensive Rales, rhonchi,  increased work of breathing. Abdominal:  Non-distended, non-tender & soft. No organomegaly or masses appreciated. Normal bowel sounds heard. CNS: Alert and oriented. No focal deficits. Extremities: no edema, no cyanosis    The results of significant diagnostics from this hospitalization (including imaging, microbiology, ancillary and laboratory) are listed below for reference.      Microbiology:   Recent Results (from the past 240 hour(s))  Blood Culture (routine x 2)     Status: None (Preliminary result)   Collection Time: 07/22/20  8:35 AM   Specimen: BLOOD  Result Value Ref Range Status   Specimen Description BLOOD RIGHT ANTECUBITAL  Final   Special Requests   Final    BOTTLES DRAWN AEROBIC AND ANAEROBIC Blood Culture adequate volume   Culture   Final    NO GROWTH < 24 HOURS Performed at [facility], [address], [place], [place] [address]    Report Status PENDING  Incomplete  SARS Coronavirus 2 by RT PCR (hospital order, performed in [place] hospital lab) Nasopharyngeal Nasopharyngeal Swab     Status: None   Collection Time: 07/22/20  8:35 AM   Specimen: Nasopharyngeal Swab  Result Value Ref Range Status   SARS Coronavirus 2 NEGATIVE NEGATIVE Final    Comment: (NOTE) SARS-CoV-2 target  nucleic acids are NOT DETECTED.  The SARS-CoV-2 RNA is generally detectable in upper and lower respiratory specimens during the acute phase of infection. The lowest concentration of SARS-CoV-2 viral copies this assay can detect is 250 copies / mL. A negative result does not preclude SARS-CoV-2 infection and should not be used as the sole basis for treatment or other patient management decisions.  A negative result may occur with improper specimen collection / handling, submission of specimen other than nasopharyngeal  swab, presence of viral mutation(s) within the areas targeted by this assay, and inadequate number of viral copies (<250 copies / mL). A negative result must be combined with clinical observations, patient history, and epidemiological information.  Fact Sheet for Patients:   StrictlyIdeas.no  Fact Sheet for Healthcare Providers: BankingDealers.co.za  This test is not yet approved or  cleared by the [place] FDA and has been authorized for detection and/or diagnosis of SARS-CoV-2 by FDA under an Emergency Use Authorization (EUA).  This EUA will remain in effect (meaning this test can be used) for the duration of the COVID-19 declaration under Section 564(b)(1) of the Act, 21 U.S.C. section 360bbb-3(b)(1), unless the authorization is terminated or revoked sooner.  Performed at [facility], [address] [place], [place] [address]   Blood Culture (routine x 2)     Status: None (Preliminary result)   Collection Time: 07/22/20  8:55 AM   Specimen: BLOOD  Result Value Ref Range Status   Specimen Description BLOOD LEFT AC  Final   Special Requests   Final    BOTTLES DRAWN AEROBIC AND ANAEROBIC Blood Culture adequate volume   Culture   Final    NO GROWTH < 24 HOURS Performed at [facility], [address] [place], [place] [address]    Report Status PENDING  Incomplete  Body fluid culture     Status: None (Preliminary result)   Collection Time: 07/22/20  2:41 PM   Specimen: PATH Cytology Pleural fluid  Result Value Ref Range Status   Specimen Description   Final    PLEURAL Performed at [facility], [address], [place], [place] [address]    Special Requests   Final    NONE Performed at [facility], [address] [place], [place] [address]    Gram Stain   Final    RARE WBC PRESENT, PREDOMINANTLY MONONUCLEAR NO ORGANISMS SEEN    Culture   Final    NO GROWTH < 24  HOURS Performed at [facility], [place] [address] [place], [place] [address]    Report Status PENDING  Incomplete     Labs:   CBC: Recent Labs  Lab 07/16/20 1250 07/22/20 0835 07/23/20 0433  WBC 20.0* 23.3* 25.5*  NEUTROABS 17.1* 19.6*  --   HGB 14.3 12.7* 10.9*  HCT 42.7 37.8* 32.2*  MCV 89.5 89.6 89.7  PLT 418* 448* 725   Basic Metabolic Panel: Recent Labs  Lab 07/16/20 1250 07/22/20 0835 07/22/20 2001 07/23/20 0433  NA 133* 129* 132* 131*  K 4.6 5.1 4.5 4.3  CL 100 91* 100 99  CO2 19* 18* 19* 18*  GLUCOSE 231* 253* 218* 191*  BUN 19 92* 79* 76*  CREATININE 1.07 2.89* 2.12* 2.21*  CALCIUM 8.8* 7.4* 6.8* 7.1*   Liver Function Tests: Recent Labs  Lab 07/16/20 1250 07/22/20 0835 07/22/20 2001 07/23/20 0433  AST 134* 296* 354* 314*  ALT 130* 195* 201* 199*  ALKPHOS 192* 210* 172* 177*  BILITOT 1.1 2.0* 2.1* 2.0*  PROT 5.9* 5.7* 4.5* 4.9*  ALBUMIN 2.1* 2.3* 1.8* 1.9*   BNP (last 3 results) No results for input(s): BNP in the last 8760 hours. Cardiac Enzymes: No results for input(s): CKTOTAL, CKMB, CKMBINDEX, TROPONINI in the last 168 hours. CBG: Recent Labs  Lab 07/22/20 1955 07/23/20 0733  GLUCAP 220* 194*   Hgb A1c No results for input(s): HGBA1C in the last 72 hours. Lipid Profile No results for input(s): CHOL, HDL, LDLCALC, TRIG, CHOLHDL, LDLDIRECT in the last 72 hours. Thyroid function studies No results for input(s): TSH, T4TOTAL, T3FREE, THYROIDAB in the last 72 hours.  Invalid input(s): FREET3 Anemia work up No results for input(s): VITAMINB12, FOLATE, FERRITIN, TIBC, IRON, RETICCTPCT in the last 72 hours. Urinalysis No results found for: COLORURINE, APPEARANCEUR, LABSPEC, [place], GLUCOSEU, HGBUR, BILIRUBINUR, KETONESUR, PROTEINUR, UROBILINOGEN, NITRITE, LEUKOCYTESUR       Time coordinating discharge: Over 45 minutes  SIGNED: Deatra James, MD, FACP, FHM. [facility],  Please use [email or web address] to Page If 7PM-7AM,  please contact night-coverage [email or web address] 07/23/2020, 11:39 AM

## 2020-07-23 NOTE — ED Notes (Signed)
Pt awaiting hospital bed availability.. family at bedside.

## 2020-07-24 ENCOUNTER — Inpatient Hospital Stay: Payer: Medicare Other

## 2020-07-24 LAB — PH, BODY FLUID: pH, Body Fluid: 7.4

## 2020-07-25 ENCOUNTER — Ambulatory Visit: Payer: Medicare Other

## 2020-07-25 LAB — BODY FLUID CULTURE: Culture: NO GROWTH

## 2020-07-26 ENCOUNTER — Telehealth: Payer: Self-pay | Admitting: Family Medicine

## 2020-07-26 NOTE — Telephone Encounter (Signed)
Spoke with wife to express my condolences.

## 2020-07-27 LAB — CULTURE, BLOOD (ROUTINE X 2)
Culture: NO GROWTH
Culture: NO GROWTH
Special Requests: ADEQUATE
Special Requests: ADEQUATE

## 2020-07-29 ENCOUNTER — Other Ambulatory Visit (HOSPITAL_COMMUNITY): Payer: Medicare Other

## 2020-07-29 ENCOUNTER — Encounter (HOSPITAL_COMMUNITY): Payer: Medicare Other

## 2020-07-29 ENCOUNTER — Inpatient Hospital Stay: Payer: Medicare Other

## 2020-07-29 ENCOUNTER — Institutional Professional Consult (permissible substitution): Payer: Medicare Other | Admitting: Emergency Medicine

## 2020-07-29 ENCOUNTER — Inpatient Hospital Stay: Payer: Medicare Other | Admitting: Physician Assistant

## 2020-07-30 ENCOUNTER — Other Ambulatory Visit: Payer: Medicare Other

## 2020-07-30 ENCOUNTER — Ambulatory Visit: Payer: Medicare Other | Admitting: Internal Medicine

## 2020-07-30 ENCOUNTER — Ambulatory Visit: Payer: Medicare Other

## 2020-07-31 ENCOUNTER — Ambulatory Visit: Payer: Medicare Other

## 2020-07-31 DEATH — deceased

## 2020-08-01 ENCOUNTER — Ambulatory Visit: Payer: Medicare Other

## 2020-08-06 ENCOUNTER — Other Ambulatory Visit: Payer: Medicare Other

## 2020-08-07 ENCOUNTER — Ambulatory Visit: Payer: Medicare Other | Admitting: Radiation Oncology

## 2020-08-07 ENCOUNTER — Ambulatory Visit: Payer: Medicare Other

## 2020-08-13 ENCOUNTER — Ambulatory Visit: Payer: Medicare Other | Admitting: Internal Medicine

## 2020-08-13 ENCOUNTER — Other Ambulatory Visit: Payer: Medicare Other

## 2020-08-13 ENCOUNTER — Ambulatory Visit: Payer: Medicare Other

## 2020-08-14 ENCOUNTER — Ambulatory Visit: Payer: Medicare Other | Admitting: Pulmonary Disease

## 2020-08-14 ENCOUNTER — Ambulatory Visit: Payer: Medicare Other

## 2020-08-15 ENCOUNTER — Ambulatory Visit: Payer: Medicare Other

## 2020-09-25 ENCOUNTER — Ambulatory Visit: Payer: Medicare Other | Admitting: Family Medicine

## 2021-03-10 IMAGING — US CT BIOPSY
1 series · 3 of 3 positions shown · non-contrast
Comparison: none

INDICATION: 68-year-old male with a history of likely lung cancer with
metastases referred for biopsy

[Series 1: ct biopsy · 3 of 3 slices shown]
[im 1/3]
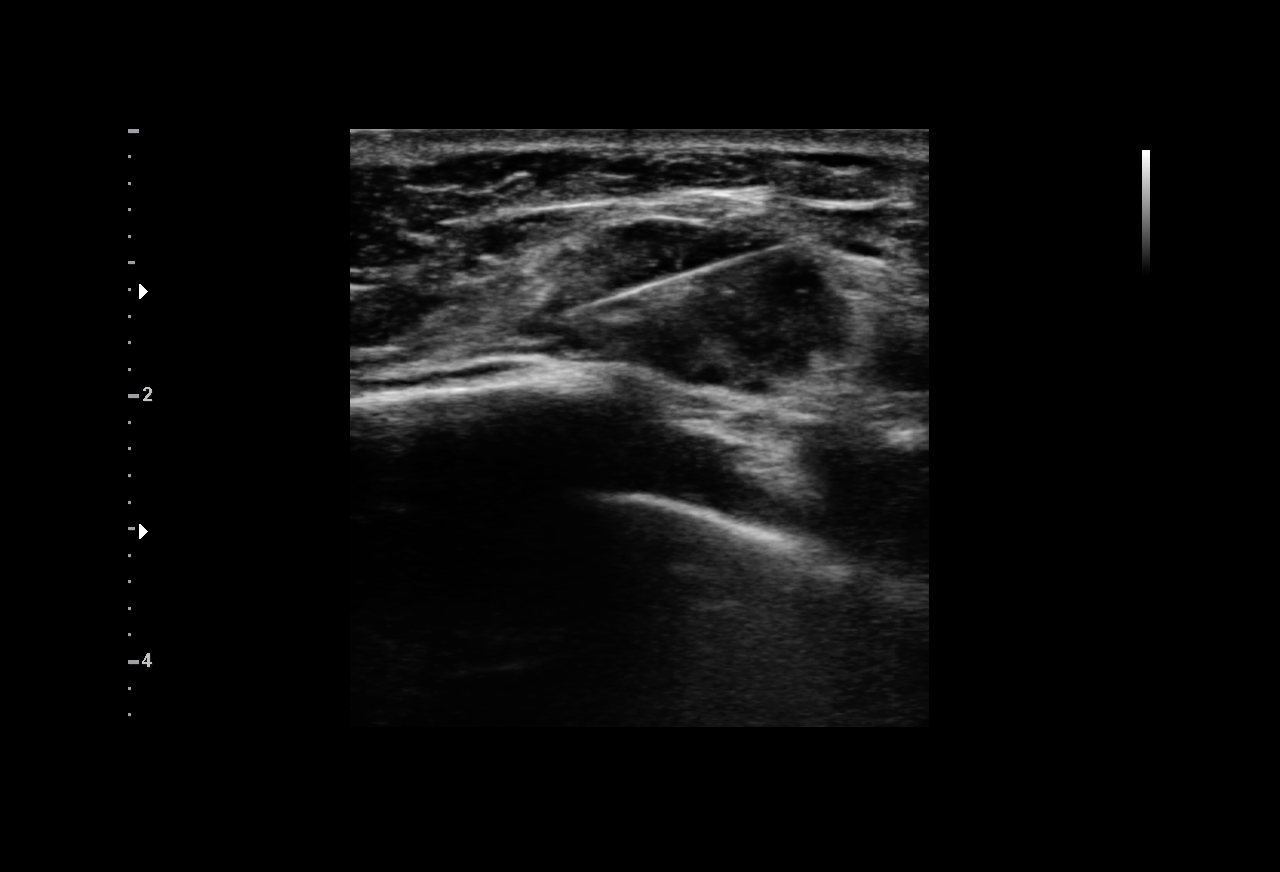
[im 2/3]
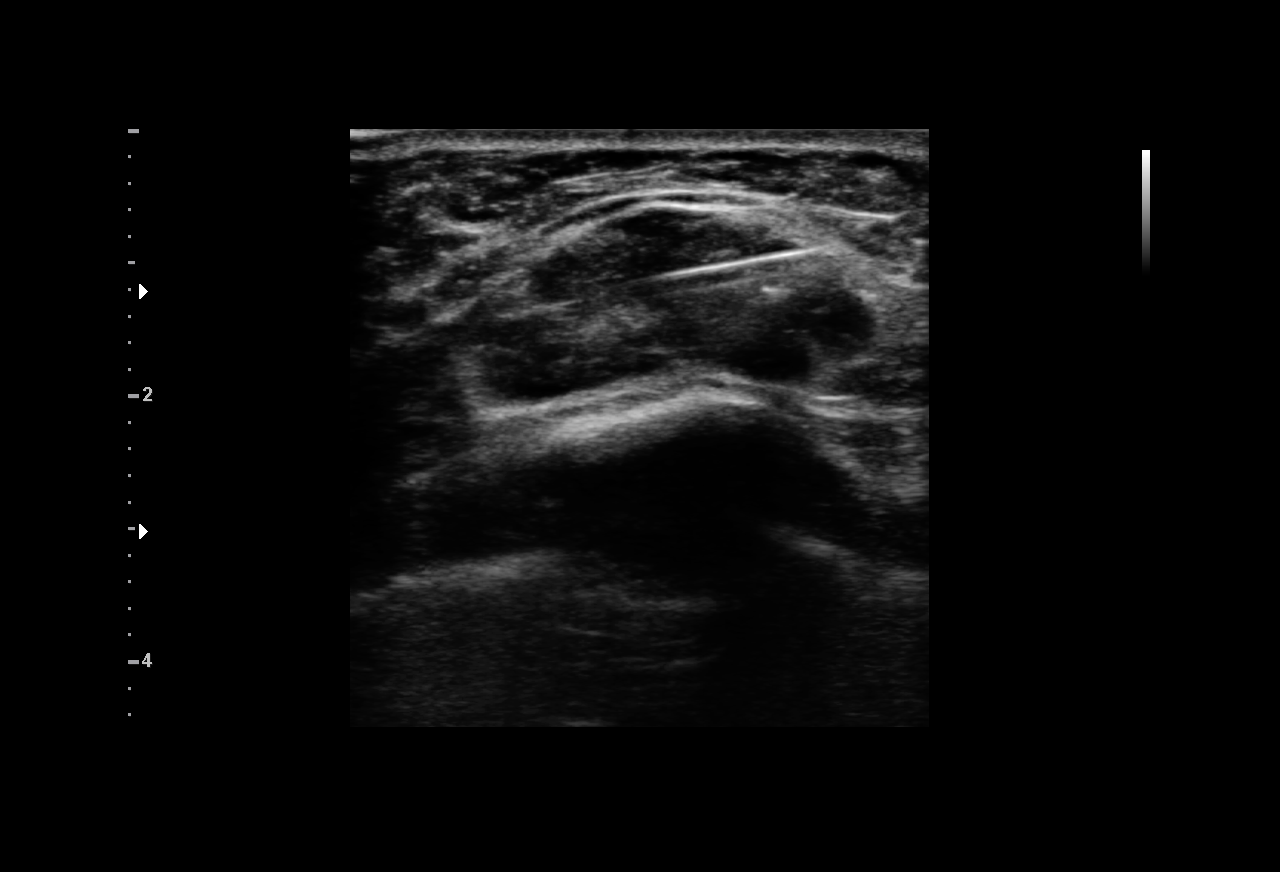
[im 3/3]
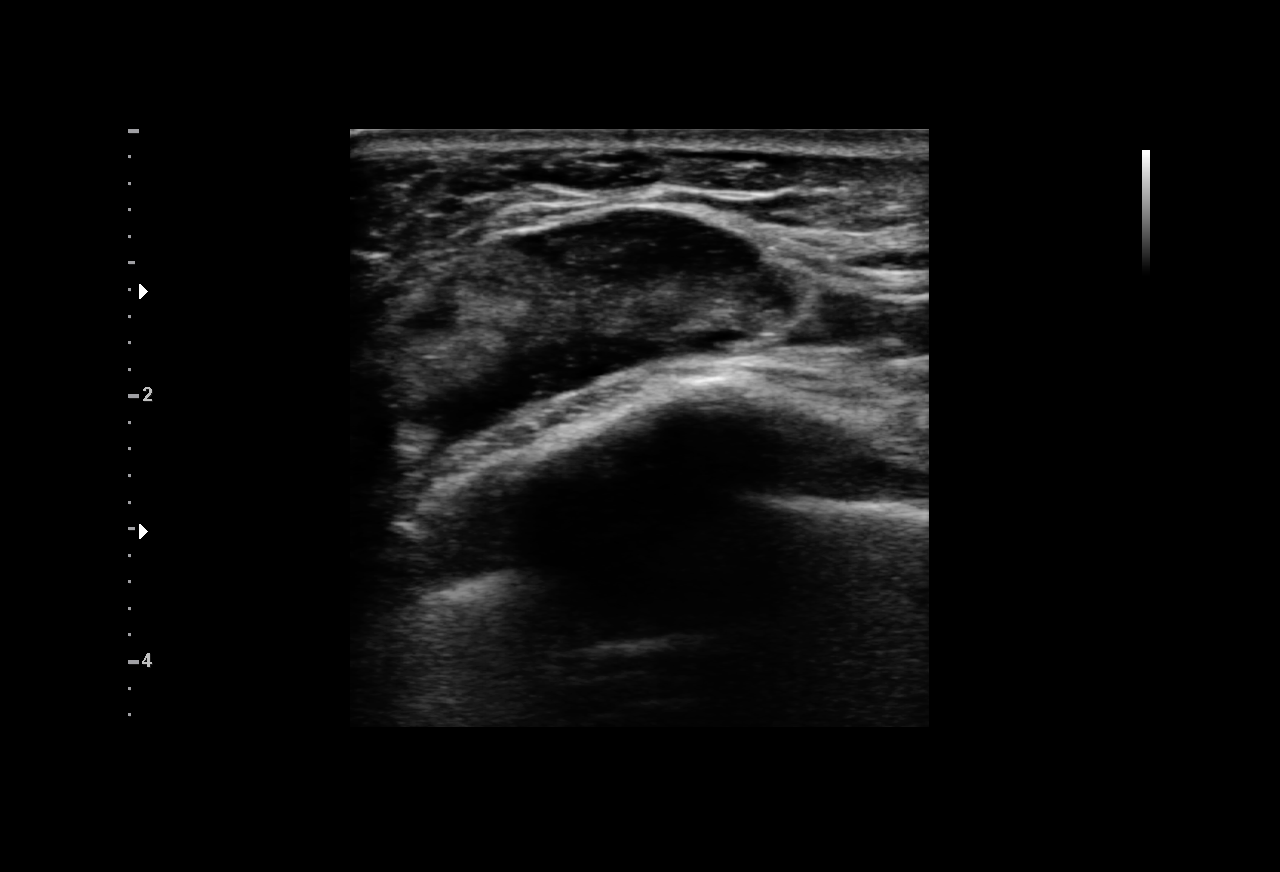

[3 of 3 positions shown; findings below may reference images not displayed]

EXAM:
IMAGE GUIDED BIOPSY LEFT CHEST WALL MASS

MEDICATIONS:
None.

ANESTHESIA/SEDATION:
Moderate (conscious) sedation was employed during this procedure. A
total of Versed 1.5 mg and Fentanyl 0 mcg was administered
intravenously.

Moderate Sedation Time: 0 minutes. The patient's level of
consciousness and vital signs were monitored continuously by
radiology nursing throughout the procedure under my direct
supervision.

FLUOROSCOPY TIME:  CT

COMPLICATIONS:
None

PROCEDURE:
Informed written consent was obtained from the patient after a
thorough discussion of the procedural risks, benefits and
alternatives. All questions were addressed. Maximal Sterile Barrier
Technique was utilized including caps, mask, sterile gowns, sterile
gloves, sterile drape, hand hygiene and skin antiseptic. A timeout
was performed prior to the initiation of the procedure.

Patient positioned supine position on the CT gantry table. Scout CT
acquired for planning purposes.

Once we confirmed the left chest wall lesion, the correlate was
identified with ultrasound.

The patient was then prepped and draped in the usual sterile
fashion. 1% lidocaine was used for local anesthesia. Ultrasound
guidance was used then 4 four separate 16 gauge core biopsy. Tissue
placed into formalin.

Sterile bandage was placed.

Patient tolerated the procedure well and remained hemodynamically
stable throughout.

No complications were encountered and no significant blood loss.
IMPRESSION: Status post image guided biopsy of left chest wall mass. Tissue
specimen sent to pathology for complete histopathologic analysis.

## 2021-03-29 IMAGING — CT CT ABD-PELV W/O CM
1 of 2 series · 12 of 31 positions shown, 15 images · non-contrast
Comparison: Chest CT 06/26/2020.  CT biopsy images 07/03/2020.

CLINICAL DATA: 68-year-old male with shortness of breath. Stage IV
lung cancer status post CT-guided biopsy of left chest wall mass
earlier this month. Right pleural effusion on portable chest x-ray
today.

EXAM:
CT CHEST, ABDOMEN AND PELVIS WITHOUT CONTRAST
TECHNIQUE: Multidetector CT imaging of the chest, abdomen and pelvis was
performed following the standard protocol without IV contrast.

[Series 2: cap wo st · axial · 0.87mm/px · z∈[-1149,-614]mm · 12 of 131 slices shown, 15 images]
[im 12/131  mediastinal]
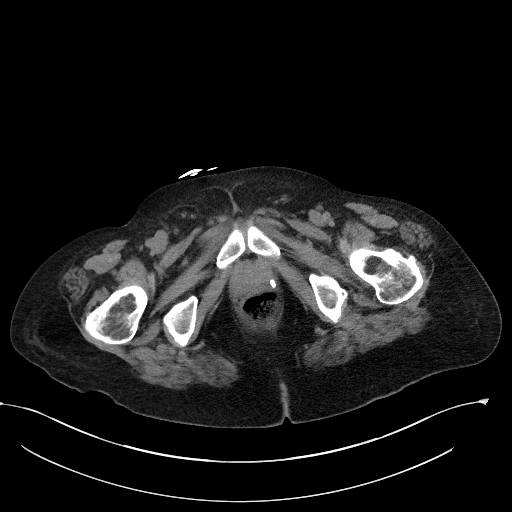
[im 12/131  lung]
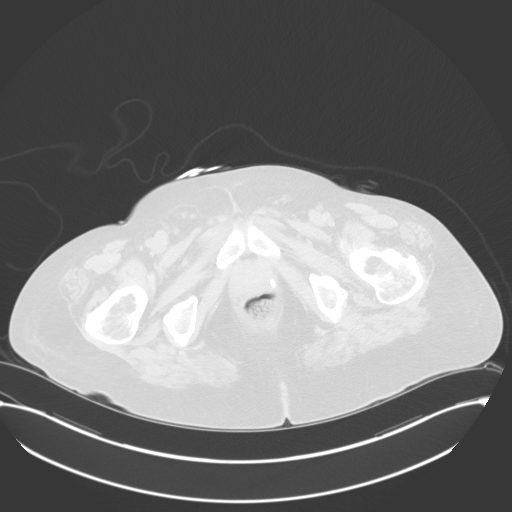
[im 24/131  lung]
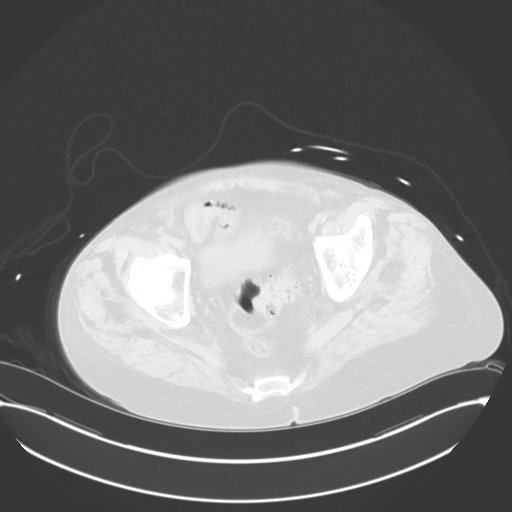
[im 36/131  lung]
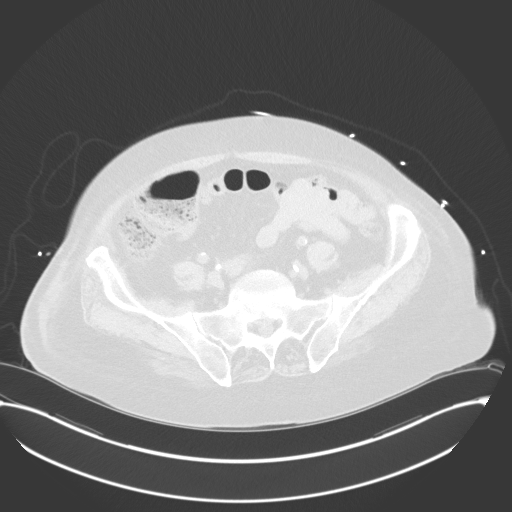
[im 48/131  lung]
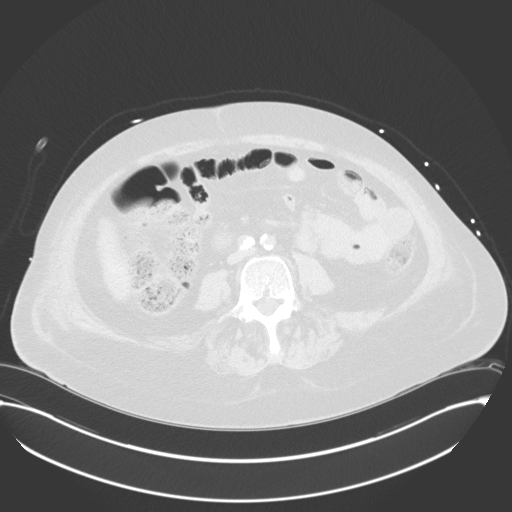
[im 60/131  mediastinal]
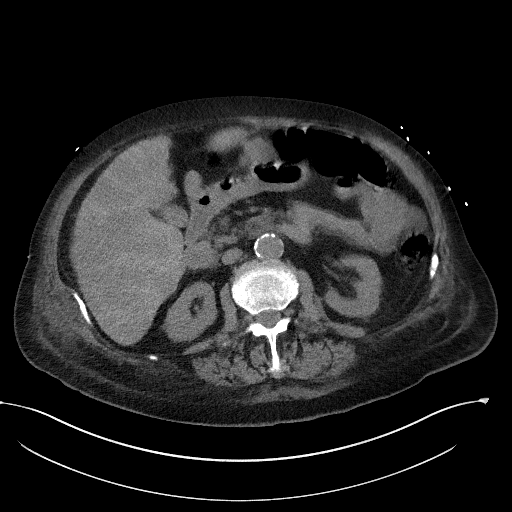
[im 60/131  lung]
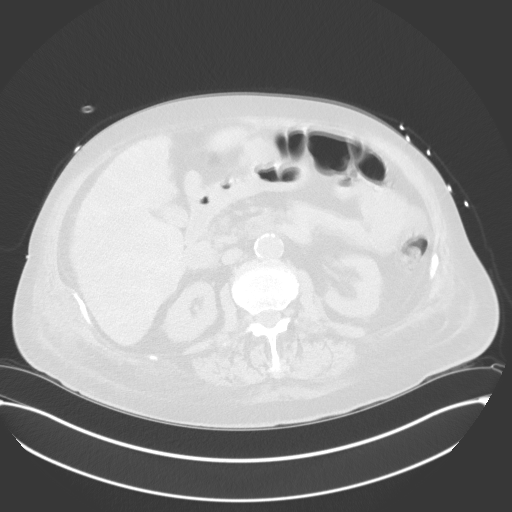
[im 64/131  lung]
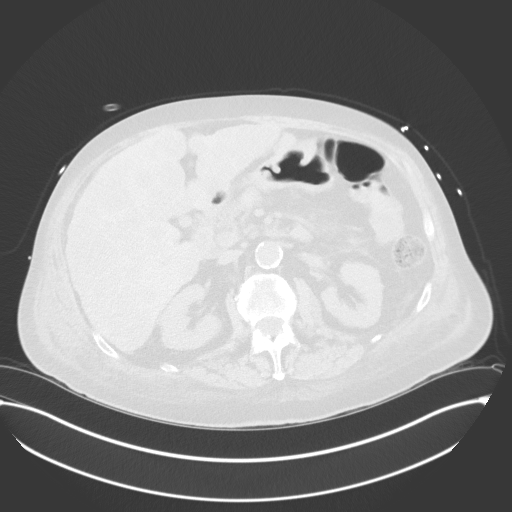
[im 66/131  lung]
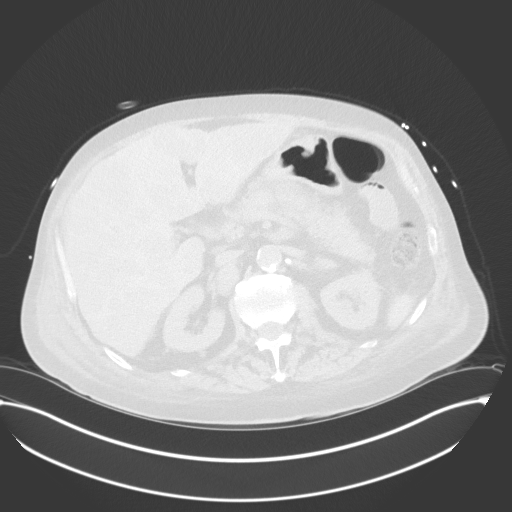
[im 71/131  lung]
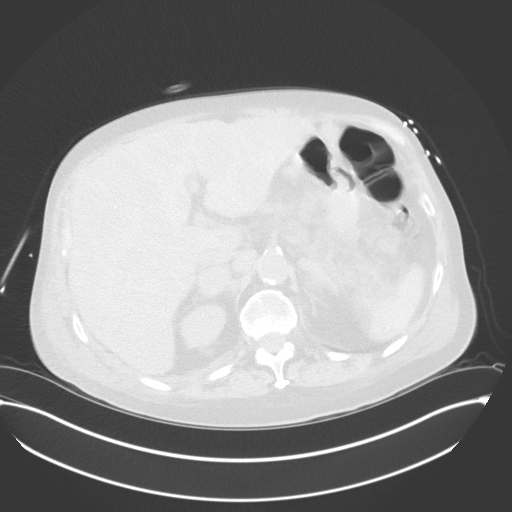
[im 83/131  mediastinal]
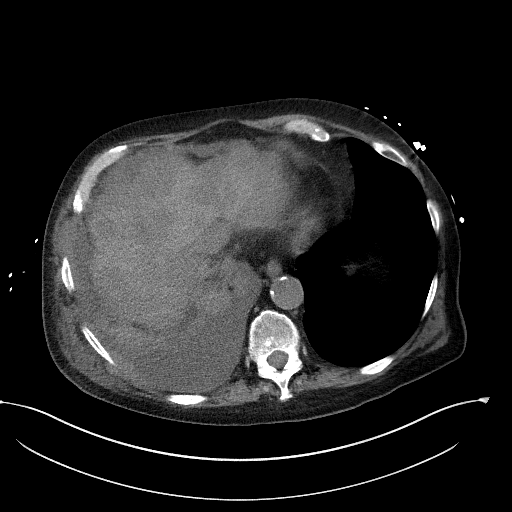
[im 83/131  lung]
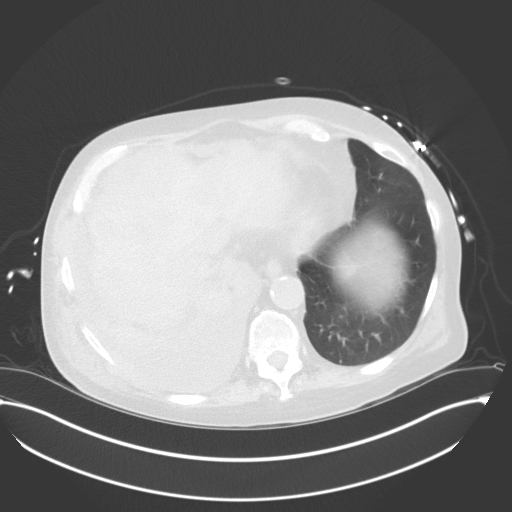
[im 95/131  lung]
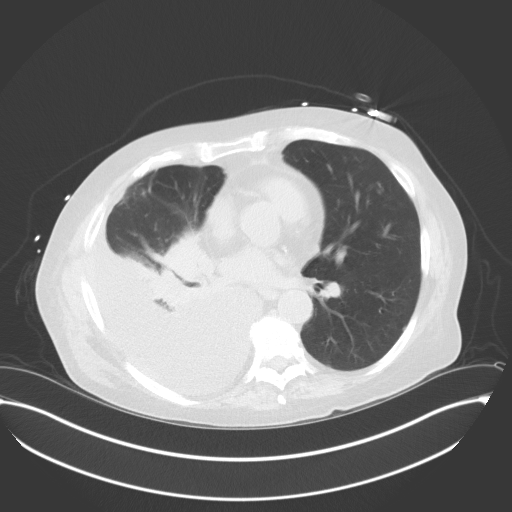
[im 107/131  lung]
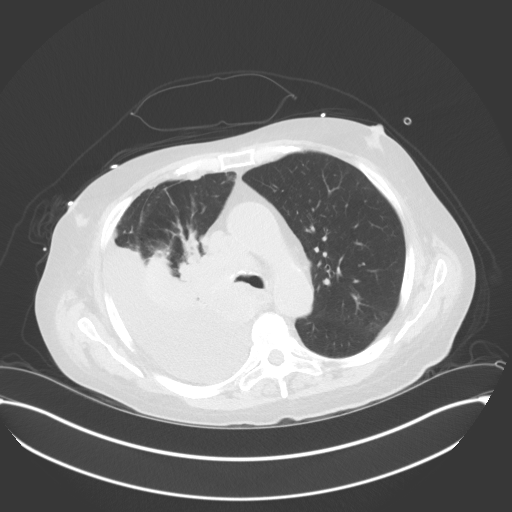
[im 119/131  lung]
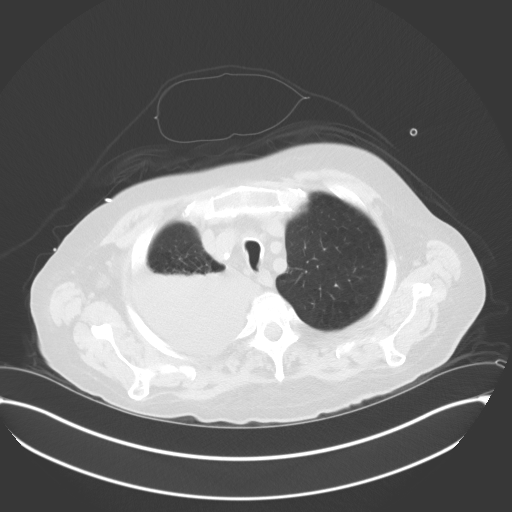

[12 of 31 positions shown; findings below may reference images not displayed]

FINDINGS: CT CHEST FINDINGS

Cardiovascular:

Due to acute renal insufficiency the patient was not a good
candidate for IV contrast today, and vascular patency is not
evaluated in the absence of IV contrast.

Calcified coronary artery and aortic atherosclerosis again noted. No
pericardial effusion. No cardiomegaly.

Mediastinum/Nodes:

Bulky mediastinal tumor and/or conglomerate malignant
lymphadenopathy virtually encases the carina (series 2, image 23)
and has not significantly changed since last month. This is
contiguous with right hilar tumor as before. Contralateral small but
conspicuous left hilar lymph nodes present last month also likely
persists.

Lungs/Pleura:

Increased since last month, and now large layering right pleural
effusion with simple fluid density (series 2, image 14).
Superimposed poorly marginated right lung tumor. An on the IV
contrast enhanced CT last month there was also pleural tumor at the
right posterolateral 7th and 8th rib levels.

And even without contrast today bulky and nodular pleural and/or
intercostal tumor is demonstrated in the right posterior
costophrenic angle (series 2, images 52-57).

The trachea and carina remain patent but there is increased and now
severe mass effect on the right mainstem bronchus and related
airways.

Subsequently there is worsening right lung ventilation.

In the left lung multiple solid-appearing pulmonary nodules have
increased by 2-3 mm each since last month, the largest is now 15 mm
in the lingula on series 4, image 112 (13 mm last month). Underlying
centrilobular emphysema. No left pleural effusion.

Musculoskeletal:

Chronic right lateral 7th rib fracture. No right rib destruction
despite evidence of multifocal right pleural and/or intercostal
tumor. No osseous metastasis identified in the thorax.

Left chest wall biopsied mass on series 2, image 41 now measures up
to 36 mm diameter, previously 30 mm). And there is evidence that
right pleural and intercostal tumor may now be invading the chest
wall on series 2, image 47.

CT ABDOMEN PELVIS FINDINGS

Hepatobiliary:

Extensive liver metastases appear increased, hypodense and resulting
altering the contour of the liver in multiple areas. Increased
perihepatic 2 cm superior diaphragmatic malignant lymph nodes versus
omental metastases (series 2, image 46). Negative gallbladder.

Pancreas: Negative.

Spleen:

Trace perisplenic fluid is new. Similar perisplenic superior
diaphragmatic nodal or omental metastases (series 2, image 52).

Adrenals/Urinary Tract:

Bilateral adrenal metastases, stable to slightly increased since
last month, now roughly 46 mm diameter on the right and 21 mm on the
left (44 mm and 21 mm respectively). Nonobstructed kidneys, with a
negative noncontrast appearance. Proximal ureters are decompressed.
Unremarkable urinary bladder. Incidental pelvic phleboliths.

Stomach/Bowel:

No dilated large or small bowel. Sigmoid diverticulosis. Some
retained stool in the large bowel. Bulky mesenteric lymphadenopathy
suspected in the left small bowel mesentery on series 2, image 86,.
And there appears to be progressed omental metastases in the left
upper quadrant adjacent to the spleen on series 2, image 52.

No free air.  No free fluid.

Vascular/Lymphatic:

Vascular patency is not evaluated in the absence of IV contrast.
Aortoiliac calcified atherosclerosis.

Increased right upper retroperitoneal lymphadenopathy in proximity
to the right costophrenic angle and adrenal metastases. Left small
bowel mesenteric lymphadenopathy suspected as above.

Reproductive: Small fat containing right inguinal hernia. Otherwise
negative.

Other: No pelvic free fluid.

Musculoskeletal:

No destructive bone metastasis identified in the lumbosacral spine,
although there are some vague areas of suspicious altered bone
mineralization, including the S2 body on series 6, image 127.
Likewise, no destructive pelvic or proximal femur bone metastasis.
IMPRESSION: 1. Noncontrast exam today due to new renal insufficiency, vascular
patency not evaluated.

2. Progression of stage IV cancer since last month, presumed right
lung primary.

3. Worsening ventilation in the right lung in part due to increased
and now moderate to large right pleural effusion in the setting of
right pleural and intercostal metastases. There is also increased
mass effect on the right mainstem bronchus.

4. Aortic Atherosclerosis (4HE83-YSL.L) and Emphysema (4HE83-QFL.V).

## 2021-03-29 IMAGING — DX DG CHEST 1V
1 series · 1 of 1 positions shown · non-contrast
Comparison: 07/22/2020

CLINICAL DATA: Pleural effusion, post thoracentesis

EXAM:
CHEST  1 VIEW

[chest ap]
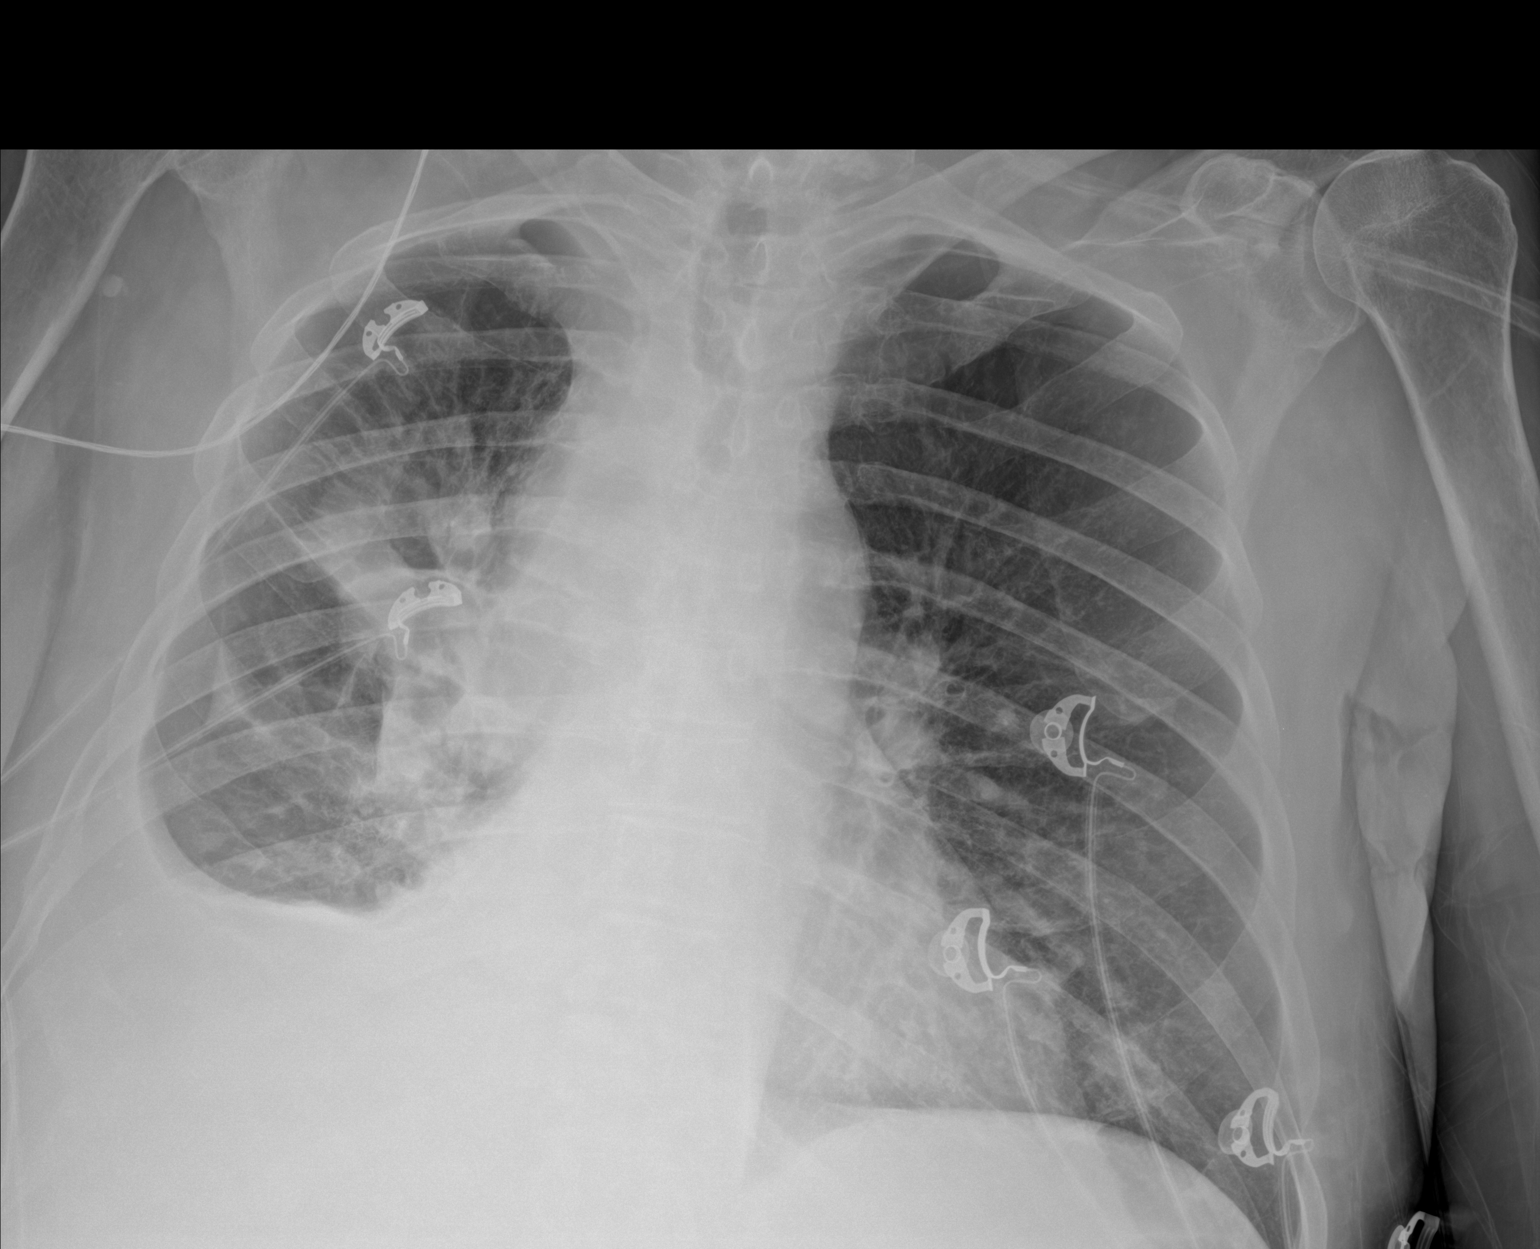

[1 of 1 positions shown; findings below may reference images not displayed]

FINDINGS: Slight decrease in the right pleural effusion which remains
moderate. There is question of a small right lateral pneumothorax
which may be related to stuck lung. Right perihilar and upper lobe
masslike opacity noted. Left lung clear. Small nodules seen in the
left lung by CT not appreciable by plain film. Heart is normal size.
IMPRESSION: Moderate right pleural effusion, decreased following thoracentesis.
Question small lateral right pneumothorax or failure of the right
lung to re-expand (stuck lung).
